# Patient Record
Sex: Female | Born: 1990 | Race: Black or African American | Hispanic: No | Marital: Single | Smoking: Former smoker
Health system: Southern US, Community
[De-identification: ages and names within clinical notes are randomized; demographics above are authoritative.]

## PROBLEM LIST (undated history)

## (undated) ENCOUNTER — Inpatient Hospital Stay (HOSPITAL_COMMUNITY): Payer: Self-pay

## (undated) ENCOUNTER — Ambulatory Visit: Admission: EM | Discharge status: Absent without leave | Payer: Medicaid Other

## (undated) DIAGNOSIS — M419 Scoliosis, unspecified: Secondary | ICD-10-CM

## (undated) DIAGNOSIS — Z789 Other specified health status: Secondary | ICD-10-CM

## (undated) DIAGNOSIS — O1490 Unspecified pre-eclampsia, unspecified trimester: Secondary | ICD-10-CM

## (undated) HISTORY — PX: NO PAST SURGERIES: SHX2092

## (undated) HISTORY — DX: Unspecified pre-eclampsia, unspecified trimester: O14.90

---
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
## 2012-06-19 ENCOUNTER — Encounter (HOSPITAL_COMMUNITY): Payer: Self-pay | Admitting: Emergency Medicine

## 2012-06-19 ENCOUNTER — Emergency Department (HOSPITAL_COMMUNITY): Payer: Federal, State, Local not specified - PPO

## 2012-06-19 ENCOUNTER — Emergency Department (HOSPITAL_COMMUNITY)
Admission: EM | Admit: 2012-06-19 | Discharge: 2012-06-19 | Disposition: A | Discharge status: Home / Self Care | Payer: Federal, State, Local not specified - PPO | Attending: Emergency Medicine | Admitting: Emergency Medicine

## 2012-06-19 DIAGNOSIS — N760 Acute vaginitis: Secondary | ICD-10-CM

## 2012-06-19 DIAGNOSIS — Z87891 Personal history of nicotine dependence: Secondary | ICD-10-CM | POA: Insufficient documentation

## 2012-06-19 DIAGNOSIS — N898 Other specified noninflammatory disorders of vagina: Secondary | ICD-10-CM | POA: Insufficient documentation

## 2012-06-19 DIAGNOSIS — N938 Other specified abnormal uterine and vaginal bleeding: Secondary | ICD-10-CM

## 2012-06-19 LAB — CBC WITH DIFFERENTIAL/PLATELET
Basophils Absolute: 0 10*3/uL (ref 0.0–0.1)
Basophils Relative: 0 % (ref 0–1)
Eosinophils Absolute: 0.1 10*3/uL (ref 0.0–0.7)
Eosinophils Relative: 2 % (ref 0–5)
Lymphocytes Relative: 40 % (ref 12–46)
MCHC: 33.6 g/dL (ref 30.0–36.0)
MCV: 87.3 fL (ref 78.0–100.0)
Platelets: 236 10*3/uL (ref 150–400)
RDW: 13.3 % (ref 11.5–15.5)
WBC: 4.5 10*3/uL (ref 4.0–10.5)

## 2012-06-19 LAB — URINALYSIS, ROUTINE W REFLEX MICROSCOPIC
Bilirubin Urine: NEGATIVE
Protein, ur: NEGATIVE mg/dL
Urobilinogen, UA: 1 mg/dL (ref 0.0–1.0)

## 2012-06-19 LAB — WET PREP, GENITAL: Trich, Wet Prep: NONE SEEN

## 2012-06-19 LAB — URINE MICROSCOPIC-ADD ON

## 2012-06-19 MED ORDER — SODIUM CHLORIDE 0.9 % IV BOLUS (SEPSIS)
1000.0000 mL | Freq: Once | INTRAVENOUS | Status: AC
  Administered 2012-06-19: 1000 mL via INTRAVENOUS

## 2012-06-19 MED ORDER — METRONIDAZOLE 500 MG PO TABS: 500.0000 mg | ORAL_TABLET | Freq: Two times a day (BID) | ORAL | Status: AC

## 2012-06-19 NOTE — ED Provider Notes (Addendum)
History     CSN: [id number]  Arrival date & time 06/19/12  1422   First MD Initiated Contact with Patient 06/19/12 1456      Chief Complaint  Patient presents with  . Vaginal Bleeding    (Consider location/radiation/quality/duration/timing/severity/associated sxs/prior treatment) HPI Comments: 21 y/o g0p0 woman w/ no medical hx, surgical hx comes in with cc of vaginal bleeding. Pt's LMP was 1 week ago. Pt states that she started having bleeding again 3 days ago. There is no pais associated with the bleeding. The blood is bright red, and she changes about 4 pads daily. There is no associated n/v/f/c/uti like symptoms. Pt has never had bleeding like this before. No clots passing. No hx of coagulopathy. Pt denies any trauma - but she did have intercourse the night before the bleeding started.  Patient is a 21 y.o. female presenting with vaginal bleeding. The history is provided by the patient.  Vaginal Bleeding Pertinent negatives include no chest pain, no abdominal pain, no headaches and no shortness of breath.    History reviewed. No pertinent past medical history.  History reviewed. No pertinent past surgical history.  No family history on file.  History  Substance Use Topics  . Smoking status: Former Games developer  . Smokeless tobacco: Never Used  . Alcohol Use: No    OB History    Grav Para Term Preterm Abortions TAB SAB Ect Mult Living                  Review of Systems  Constitutional: Positive for activity change.  HENT: Negative for neck pain.   Respiratory: Negative for shortness of breath.   Cardiovascular: Negative for chest pain.  Gastrointestinal: Negative for nausea, vomiting and abdominal pain.  Genitourinary: Positive for hematuria and vaginal bleeding. Negative for dysuria, frequency, flank pain, vaginal discharge, vaginal pain, menstrual problem and pelvic pain.  Neurological: Negative for headaches.    Allergies  Review of patient's allergies indicates no  known allergies.  Home Medications   Current Outpatient Rx  Name Route Sig Dispense Refill  . LEVONORGESTREL-ETHINYL ESTRAD 0.1-20 MG-MCG PO TABS Oral Take 1 tablet by mouth daily.      BP 135/73  Pulse 86  Temp 98.6 F (37 C) (Oral)  SpO2 100%  LMP 06/08/2012  Physical Exam  Constitutional: She is oriented to person, place, and time. She appears well-developed.  HENT:  Head: Normocephalic and atraumatic.  Eyes: Conjunctivae and EOM are normal. Pupils are equal, round, and reactive to light.  Neck: Normal range of motion. Neck supple.  Cardiovascular: Normal rate, regular rhythm, normal heart sounds and intact distal pulses.   No murmur heard. Pulmonary/Chest: Effort normal. No respiratory distress. She has no wheezes.  Abdominal: Soft. Bowel sounds are normal. She exhibits no distension. There is no tenderness. There is no rebound and no guarding.  Genitourinary: Vagina normal and uterus normal.       External exam - normal, no lesions Speculum exam: Pt has some white discharge, + blood. No laceration seen, bleeding seems to be from the cervix, as there is trace amount of red discharge noted post cleaning. Bimanual exam: Patient has no CMT, no adnexal tenderness or fullness and cervical os is closed  Neurological: She is alert and oriented to person, place, and time.  Skin: Skin is warm and dry.    ED Course  Procedures (including critical care time)   Labs Reviewed  URINALYSIS, ROUTINE W REFLEX MICROSCOPIC  PREGNANCY, URINE  GC/CHLAMYDIA PROBE  AMP, GENITAL  WET PREP, GENITAL   No results found.   No diagnosis found.    MDM  DDx includes: Ectopic pregnancy Ovarian cyst/hemorrhagic cyst Trauma/laceration Infectious processes - including STD Dysfunctional uterine bleeding Fibroid Foreign body Placental Previa Placenta abruption Uterine rupture Abortion - threatened, inevitable, spontaneous   22 y/o G0P0 woman with no medical, surgical or gyne hx comes  in with cc of vaginal bleeding. Will get a upreg and then further differentiate. Will need a pelvic exam. Will check CBC.          Derwood Kaplan, MD 06/19/12 1520  Derwood Kaplan, MD 06/19/12 1718

## 2012-06-19 NOTE — ED Notes (Signed)
Onset of bright red vaginal bleeding 3 days ago, heavy w/o clots. Normal period started Tuesday a week ago and ended Saturday.

## 2012-06-19 NOTE — ED Notes (Signed)
Patient is alert and oriented x3.  She states that she had a period cycle last week  And she has been vaginal bleeding for 3 days with no clots.  She states that she blood was Bright red for the last two days.  She denies any pain with the bleeding.

## 2012-06-22 ENCOUNTER — Ambulatory Visit (INDEPENDENT_AMBULATORY_CARE_PROVIDER_SITE_OTHER): Payer: Federal, State, Local not specified - PPO | Admitting: Physician Assistant

## 2012-06-22 VITALS — BP 122/80 | HR 76 | Temp 98.0°F | Resp 16 | Ht 65.25 in | Wt 119.6 lb

## 2012-06-22 DIAGNOSIS — Z111 Encounter for screening for respiratory tuberculosis: Secondary | ICD-10-CM

## 2012-06-22 LAB — GC/CHLAMYDIA PROBE AMP, GENITAL: GC Probe Amp, Genital: NEGATIVE

## 2012-06-22 NOTE — Progress Notes (Signed)
  Subjective:    Patient ID: Katie Brown, female    DOB: 02/22/1991, 21 y.o.   MRN: [id number]  HPI  Pt here for PPD placement.  She has gotten a new job and needs this done.  She has had a PPD in the past but never had a + result.  Review of Systems     Objective:   Physical Exam  Vitals reviewed. Constitutional: She appears well-developed and well-nourished.  HENT:  Head: Normocephalic and atraumatic.  Right Ear: External ear normal.  Left Ear: External ear normal.  Pulmonary/Chest: Effort normal.  Skin: Skin is warm and dry.  Psychiatric: She has a normal mood and affect. Her behavior is normal. Judgment and thought content normal.      Assessment & Plan:  PPD placed.

## 2012-06-24 ENCOUNTER — Encounter (INDEPENDENT_AMBULATORY_CARE_PROVIDER_SITE_OTHER): Payer: Federal, State, Local not specified - PPO

## 2012-06-24 DIAGNOSIS — Z111 Encounter for screening for respiratory tuberculosis: Secondary | ICD-10-CM

## 2012-06-25 LAB — TB SKIN TEST

## 2012-12-06 ENCOUNTER — Telehealth: Payer: Self-pay

## 2012-12-06 NOTE — Telephone Encounter (Signed)
PT HAD A PE DONE AND WOULD LIKE TO COME BY AND P/U A COPY. STATES SHE NEED IT TODAY IF AT ALL POSSIBLE PLEASE CALL [phone]

## 2012-12-06 NOTE — Telephone Encounter (Signed)
Chart only shows TB placement done here, not PE. Records are in drawer ready to be picked up. LMOM

## 2013-09-30 ENCOUNTER — Emergency Department (HOSPITAL_COMMUNITY)
Admission: EM | Admit: 2013-09-30 | Discharge: 2013-09-30 | Disposition: A | Discharge status: Home / Self Care | Payer: Federal, State, Local not specified - PPO | Attending: Emergency Medicine | Admitting: Emergency Medicine

## 2013-09-30 DIAGNOSIS — N949 Unspecified condition associated with female genital organs and menstrual cycle: Secondary | ICD-10-CM | POA: Insufficient documentation

## 2013-09-30 DIAGNOSIS — R197 Diarrhea, unspecified: Secondary | ICD-10-CM | POA: Insufficient documentation

## 2013-09-30 DIAGNOSIS — Z3202 Encounter for pregnancy test, result negative: Secondary | ICD-10-CM | POA: Insufficient documentation

## 2013-09-30 DIAGNOSIS — N910 Primary amenorrhea: Secondary | ICD-10-CM

## 2013-09-30 DIAGNOSIS — Z87891 Personal history of nicotine dependence: Secondary | ICD-10-CM | POA: Insufficient documentation

## 2013-09-30 DIAGNOSIS — R1031 Right lower quadrant pain: Secondary | ICD-10-CM | POA: Insufficient documentation

## 2013-09-30 DIAGNOSIS — N938 Other specified abnormal uterine and vaginal bleeding: Secondary | ICD-10-CM | POA: Insufficient documentation

## 2013-09-30 DIAGNOSIS — Z79899 Other long term (current) drug therapy: Secondary | ICD-10-CM | POA: Insufficient documentation

## 2013-09-30 LAB — URINE MICROSCOPIC-ADD ON

## 2013-09-30 LAB — URINALYSIS, ROUTINE W REFLEX MICROSCOPIC
Bilirubin Urine: NEGATIVE
Ketones, ur: NEGATIVE mg/dL
Protein, ur: NEGATIVE mg/dL
Urobilinogen, UA: 1 mg/dL (ref 0.0–1.0)

## 2013-09-30 MED ORDER — ACETAMINOPHEN 325 MG PO TABS
975.0000 mg | ORAL_TABLET | Freq: Once | ORAL | Status: AC
  Administered 2013-09-30: 975 mg via ORAL
  Filled 2013-09-30: qty 3

## 2013-09-30 NOTE — ED Provider Notes (Signed)
CSN: [id number]     Arrival date & time 09/30/13  0902 History   First MD Initiated Contact with Patient 09/30/13 [phone]     No chief complaint on file.  (Consider location/radiation/quality/duration/timing/severity/associated sxs/prior Treatment) HPI Patient presents with vaginal bleeding, vaginal cramping, and 1 episode of diarrhea with RLQ pain this morning.  The pain in her RLQ woke her from sleep, was sharp and achy, and resolved after the episode of diarrhea.  Pt has had vaginal bleeding that began today, has not had a period since June.  Had negative pregnancy test in September.  Denies current abdominal pain.  Denies urinary symptoms, n/V, fever, chills.   No past medical history on file. No past surgical history on file. No family history on file. History  Substance Use Topics  . Smoking status: Former Games developer  . Smokeless tobacco: Never Used  . Alcohol Use: No   OB History   Grav Para Term Preterm Abortions TAB SAB Ect Mult Living                 Review of Systems  Constitutional: Negative for fever and chills.  Respiratory: Negative for cough and shortness of breath.   Cardiovascular: Negative for chest pain.  Gastrointestinal: Positive for abdominal pain and diarrhea. Negative for nausea and vomiting.  Genitourinary: Positive for vaginal bleeding and menstrual problem. Negative for dysuria, urgency, frequency and vaginal discharge.  Musculoskeletal: Negative for myalgias.    Allergies  Review of patient's allergies indicates no known allergies.  Home Medications   Current Outpatient Rx  Name  Route  Sig  Dispense  Refill  . Probiotic Product (PROBIOTIC PO)   Oral   Take 1 capsule by mouth daily.          BP 119/73  Pulse 75  Temp(Src) 98.3 F (36.8 C) (Oral)  Resp 18  SpO2 100% Physical Exam  Nursing note and vitals reviewed. Constitutional: She appears well-developed and well-nourished. No distress.  HENT:  Head: Normocephalic and atraumatic.  Neck:  Neck supple.  Cardiovascular: Normal rate and regular rhythm.   Pulmonary/Chest: Effort normal and breath sounds normal. No respiratory distress. She has no wheezes. She has no rales.  Abdominal: Soft. Bowel sounds are normal. She exhibits no distension. There is no tenderness. There is no rebound and no guarding.  Neurological: She is alert.  Skin: She is not diaphoretic.    ED Course  Procedures (including critical care time) Labs Review Labs Reviewed  WET PREP, GENITAL  GC/CHLAMYDIA PROBE AMP  URINALYSIS, ROUTINE W REFLEX MICROSCOPIC  POCT PREGNANCY, URINE   Imaging Review No results found.  EKG Interpretation   None      10:52 AM Pt requests d/c home.  States the cramping is feeling more like her normal menstrual period now and she is having bleeding, states she was initially worried because it was more on one side than the other and that is not normal for her.  Denies any urinary symptoms, abnormal vaginal discharge, or any concerns for vaginal infection.  Will therefore cancel pelvic exam and labs.  Pt declines waiting for UA results.   MDM   1. Delayed menses    Pt with no period for several months p/w lower abdominal pain and vaginal bleeding.  She did have one episode of RLQ pain that resolved with one episode of diarrhea this morning, then began developing her normal vaginal bleeding and cramping associated with menses.  She is not pregnant.  No other associated symptoms.  Pt requested discharge prior to completion of all studies- see note above.  I do agree that this is very likely to be her menstrual period.  Discussed result, findings, treatment, and follow up  with patient.  Pt given return precautions.  Pt verbalizes understanding and agrees with plan.      I doubt any other EMC precluding discharge at this time including, but not necessarily limited to the following: appendicitis, PID (from history only), ectopic pregnancy    Trixie Dredge, PA-C 09/30/13 1058

## 2013-09-30 NOTE — ED Provider Notes (Signed)
Medical screening examination/treatment/procedure(s) were performed by non-physician practitioner and as supervising physician I was immediately available for consultation/collaboration.  EKG Interpretation   None         Shanna Cisco, MD 09/30/13 2228

## 2013-09-30 NOTE — ED Notes (Addendum)
Pt stated that she has right lower abd pain and vaginal bleeding that started this morning. Pt stated that she has not had a period since June which is normal for her to have irregular periods. She took a pregnancy test and it was negative.

## 2013-09-30 NOTE — ED Notes (Signed)
Family at bedside. 

## 2013-09-30 NOTE — ED Notes (Signed)
Pt refusing pelvic exam.

## 2013-09-30 NOTE — Discharge Instructions (Signed)
Read the information below.  You may return to the Emergency Department at any time for worsening condition or any new symptoms that concern you. Please follow up with your obgyn about your irregular periods.  If you develop high fevers, worsening abdominal pain, uncontrolled vomiting, or are unable to tolerate fluids by mouth, return to the ER for a recheck.

## 2016-04-24 ENCOUNTER — Ambulatory Visit (INDEPENDENT_AMBULATORY_CARE_PROVIDER_SITE_OTHER): Payer: Federal, State, Local not specified - PPO | Admitting: Physician Assistant

## 2016-04-24 VITALS — BP 112/76 | HR 66 | Temp 98.3°F | Resp 16 | Ht 66.0 in | Wt 137.0 lb

## 2016-04-24 DIAGNOSIS — Z711 Person with feared health complaint in whom no diagnosis is made: Secondary | ICD-10-CM

## 2016-04-24 DIAGNOSIS — Z111 Encounter for screening for respiratory tuberculosis: Secondary | ICD-10-CM

## 2016-04-24 DIAGNOSIS — IMO0001 Reserved for inherently not codable concepts without codable children: Secondary | ICD-10-CM

## 2016-04-24 NOTE — Addendum Note (Signed)
Addended by: Trena PlattENGLISH, Carollee Nussbaumer D on: 04/24/2016 02:28 PM   Modules accepted: Level of Service

## 2016-04-24 NOTE — Progress Notes (Signed)
[facility] [address], [place] [address] [phone]  Date:  04/24/2016   Name:  Katie Brown   DOB:  10-Jun-1991   MRN:  [id number]  PCP:  No primary care provider on file.    History of Present Illness:  Katie Brown is a 25 y.o. female patient [facility]    To Whom It May Concern:  This patient was screened for tuberculosis using current guidelines from the Centers for Disease Control, and does not have tuberculosis in the communicable form.   Tuberculosis Risk Questionnaire  1. no Were you born outside the [place] in one of the following parts of the world: [place], [place], [place], [place] or [place]?    2. no Have you traveled outside the [place] and lived for more than one month in one of the following parts of the world: [place], [place], [place], [place] or [place]?    3. no Do you have a compromised immune system such as from any of the following conditions:HIV/AIDS, organ or bone marrow transplantation, diabetes, immunosuppressive medicines (e.g. Prednisone, Remicaide), leukemia, lymphoma, cancer of the head or neck, gastrectomy or jejunal bypass, end-stage renal disease (on dialysis), or silicosis?     4. no Have you ever or do you plan on working in: a residential care center, a health care facility, a jail or prison or homeless shelter?    5. no Have you ever: injected illegal drugs, used crack cocaine, lived in a homeless shelter  or been in jail or prison?     6. no Have you ever been exposed to anyone with infectious tuberculosis?    Tuberculosis Symptom Questionnaire  Do you currently have any of the following symptoms?  1. no  Unexplained cough lasting more than 3 weeks?   2.  no  Unexplained fever lasting more than 3 weeks.   3. no Night Sweats (sweating that leaves the bedclothes and sheets wet)     4. no Shortness of Breath   5. no Chest Pain   6. no Unintentional weight loss     7. no Unexplained fatigue (very tired for no reason)     There are no active problems to display for this patient.   History reviewed. No pertinent past medical history.  History reviewed. No pertinent past surgical history.  Social History  Substance Use Topics  . Smoking status: Former Games developermoker  . Smokeless tobacco: Never Used  . Alcohol Use: No    History reviewed. No pertinent family history.  No Known Allergies  Medication list has been reviewed and updated.  No current outpatient prescriptions on file prior to visit.   No current facility-administered medications on file prior to visit.    Review of Systems  Constitutional: Positive for chills. Negative for fever.  Respiratory: Negative for cough, shortness of breath and wheezing.   Cardiovascular: Negative for chest pain, palpitations and leg swelling.  Musculoskeletal: Negative for back pain and joint pain.  Skin: Negative for itching and rash.  Neurological: Negative for dizziness.     Physical Examination: BP 112/76 mmHg  Pulse 66  Temp(Src) 98.3 F (36.8 C) (Oral)  Resp 16  Ht 5\' 6"  (1.676 m)  Wt 137 lb (62.143 kg)  BMI 22.12 kg/m2  SpO2 100%  LMP 04/24/2016 (Exact Date) Ideal Body Weight: Weight in (lb) to have BMI = 25: 154.6  Physical Exam  Constitutional: She is oriented to person, place, and time.  She appears well-developed and well-nourished. No distress.  HENT:  Head: Normocephalic and atraumatic.  Right Ear: External ear normal.  Left Ear: External ear normal.  Eyes: Conjunctivae and EOM are normal. Pupils are equal, round, and reactive to light.  Cardiovascular: Normal rate, regular rhythm and normal heart sounds.  Exam reveals no friction rub.   No murmur heard. Pulmonary/Chest: Effort normal and breath sounds normal. No respiratory distress. She has no wheezes.  Musculoskeletal: Normal range of motion. She exhibits no edema or tenderness.  Neurological: She is alert and oriented  to person, place, and time.  Skin: Skin is warm and dry. She is not diaphoretic.  Psychiatric: She has a normal mood and affect. Her behavior is normal.     Assessment and Plan: Katie Brown is a 25 y.o. female who is here today for need of TB test and completion of form Tb placed today.   Questionnaire non-concerning.  rtc in 48-72 hours for ppd read.   PPD screening test - Plan: TB Skin Test, Care order/instruction  Normal appearance   Trena Platt, PA-C [facility] 04/24/2016 11:29 AM

## 2016-04-24 NOTE — Patient Instructions (Signed)
Tuberculin Skin Test WHY AM I HAVING THIS TEST? Tuberculosis (TB) is a bacterial infection caused by Mycobacterium tuberculosis. Most people who are exposed to these bacteria have a strong enough defense (immune) system to prevent the bacteria from causing TB and developing symptoms. Their bodies prevent the germs from being active and making them sick (latent TB infection).  However, if you have TB germs in your body and your immune system is weak, you can develop a TB infection. This can cause symptoms such as:   Night sweats.  Fever.  Weakness.  Weight loss. A latent TB infection can also become active later in life if your immune system becomes weakened or compromised. You may have this test if your health care provider suspects that you have TB. You may also have this test to screen for TB if you are at risk for getting the disease. Those at increased risk include:  People who inject illegal drugs or share needles.  People with HIV or other diseases that affect immunity.  Health care workers.  People who live in high-risk communities, such as homeless shelters, nursing homes, and correctional facilities.  People who have been in contact with someone with TB.  People from countries where TB is more common. If you are in a high-risk group, your health care provider may wish to screen for TB more often. This can help prevent the spread of the disease. Sometimes TB screening is required when starting a new job, such as becoming a health care worker or a teacher. Colleges or universities may require it of new students. HOW WILL I BE TESTED? A tuberculin skin test is the main test used to check for exposure to the bacteria that can cause TB. The test checks for antibodies to the bacteria. Antibodies are proteins that your body produces to protect you from germs and other things that can make you sick. Your health care provider will inject a solution known as PPD (purified protein  derivative) under the first layer of skin on your arm. This causes a blister-like bubble to form at the site. Your health care provider will then examine the site after a number of hours have passed to see if a reaction has occurred. HOW DO I PREPARE FOR THE TEST? There is no preparation required for this test. WHAT DO THE RESULTS MEAN? Your test results will be reported as either negative or positive.  If the tuberculin skin test produces a negative result, it is likely that you do not have TB and have not been exposed to the TB bacteria. If you or your health care provider suspects exposure, however, you may want to repeat the test a few weeks later. A blood test may also be used to check for TB. This is because you will not react to the tuberculin skin test until several weeks after exposure to TB bacteria. If you test positive to the tuberculin skin test, it is likely that you have been exposed to TB bacteria. The test does not distinguish between an active and a latent TB infection. A false-positive result can occur. A false-positive result for TB bacteria is incorrect because it indicates a condition or finding is present when it is not. Talk to your health care provider to discuss your results, treatment options, and if necessary, the need for more tests. It is your responsibility to obtain your test results. Ask the lab or department performing the test when and how you will get your results. Talk with your   health care provider if you have any questions about your results.   This information is not intended to replace advice given to you by your health care provider. Make sure you discuss any questions you have with your health care provider.   Document Released: 08/20/2005 Document Revised: 12/01/2014 Document Reviewed: 03/06/2014 Elsevier Interactive Patient Education 2016 Elsevier Inc.  

## 2017-03-03 ENCOUNTER — Encounter (HOSPITAL_COMMUNITY): Payer: Self-pay

## 2017-03-03 ENCOUNTER — Emergency Department (HOSPITAL_COMMUNITY)
Admission: EM | Admit: 2017-03-03 | Discharge: 2017-03-03 | Disposition: A | Discharge status: Home / Self Care | Payer: Federal, State, Local not specified - PPO | Attending: Emergency Medicine | Admitting: Emergency Medicine

## 2017-03-03 ENCOUNTER — Emergency Department (HOSPITAL_COMMUNITY): Payer: Federal, State, Local not specified - PPO

## 2017-03-03 DIAGNOSIS — Y92410 Unspecified street and highway as the place of occurrence of the external cause: Secondary | ICD-10-CM | POA: Diagnosis not present

## 2017-03-03 DIAGNOSIS — Z87891 Personal history of nicotine dependence: Secondary | ICD-10-CM | POA: Diagnosis not present

## 2017-03-03 DIAGNOSIS — M542 Cervicalgia: Secondary | ICD-10-CM | POA: Insufficient documentation

## 2017-03-03 DIAGNOSIS — Y939 Activity, unspecified: Secondary | ICD-10-CM | POA: Diagnosis not present

## 2017-03-03 DIAGNOSIS — S3992XA Unspecified injury of lower back, initial encounter: Secondary | ICD-10-CM | POA: Insufficient documentation

## 2017-03-03 DIAGNOSIS — Y999 Unspecified external cause status: Secondary | ICD-10-CM | POA: Insufficient documentation

## 2017-03-03 LAB — POC URINE PREG, ED: Preg Test, Ur: NEGATIVE

## 2017-03-03 MED ORDER — CYCLOBENZAPRINE HCL 10 MG PO TABS
10.0000 mg | ORAL_TABLET | Freq: Once | ORAL | Status: AC
  Administered 2017-03-03: 10 mg via ORAL
  Filled 2017-03-03: qty 1
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
## 2017-03-03 NOTE — ED Notes (Signed)
Pt reported to have left from ED xray. NP made aware.
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
## 2017-10-24 ENCOUNTER — Emergency Department: Payer: Federal, State, Local not specified - PPO

## 2017-10-24 ENCOUNTER — Other Ambulatory Visit: Payer: Self-pay

## 2017-10-24 ENCOUNTER — Emergency Department
Admission: EM | Admit: 2017-10-24 | Discharge: 2017-10-25 | Disposition: A | Discharge status: Home / Self Care | Payer: Federal, State, Local not specified - PPO | Attending: Emergency Medicine | Admitting: Emergency Medicine

## 2017-10-24 ENCOUNTER — Encounter: Payer: Self-pay | Admitting: Emergency Medicine

## 2017-10-24 DIAGNOSIS — R112 Nausea with vomiting, unspecified: Secondary | ICD-10-CM | POA: Diagnosis present

## 2017-10-24 DIAGNOSIS — Z87891 Personal history of nicotine dependence: Secondary | ICD-10-CM | POA: Insufficient documentation

## 2017-10-24 DIAGNOSIS — O9989 Other specified diseases and conditions complicating pregnancy, childbirth and the puerperium: Secondary | ICD-10-CM | POA: Diagnosis not present

## 2017-10-24 DIAGNOSIS — R109 Unspecified abdominal pain: Secondary | ICD-10-CM

## 2017-10-24 DIAGNOSIS — N83202 Unspecified ovarian cyst, left side: Secondary | ICD-10-CM | POA: Insufficient documentation

## 2017-10-24 DIAGNOSIS — Z3490 Encounter for supervision of normal pregnancy, unspecified, unspecified trimester: Secondary | ICD-10-CM | POA: Insufficient documentation

## 2017-10-24 DIAGNOSIS — O219 Vomiting of pregnancy, unspecified: Secondary | ICD-10-CM | POA: Insufficient documentation

## 2017-10-24 LAB — CBC
HCT: 44.1 % (ref 35.0–47.0)
HEMOGLOBIN: 14.9 g/dL (ref 12.0–16.0)
MCH: 29.6 pg (ref 26.0–34.0)
MCHC: 33.8 g/dL (ref 32.0–36.0)
MCV: 87.6 fL (ref 80.0–100.0)
Platelets: 323 10*3/uL (ref 150–440)
RBC: 5.04 MIL/uL (ref 3.80–5.20)
RDW: 13.9 % (ref 11.5–14.5)
WBC: 8.3 10*3/uL (ref 3.6–11.0)

## 2017-10-24 LAB — URINALYSIS, COMPLETE (UACMP) WITH MICROSCOPIC
Bilirubin Urine: NEGATIVE
GLUCOSE, UA: NEGATIVE mg/dL
HGB URINE DIPSTICK: NEGATIVE
KETONES UR: 20 mg/dL — AB
LEUKOCYTES UA: NEGATIVE
NITRITE: NEGATIVE
PROTEIN: 30 mg/dL — AB
RBC / HPF: NONE SEEN RBC/hpf (ref 0–5)
Specific Gravity, Urine: 1.029 (ref 1.005–1.030)
pH: 5 (ref 5.0–8.0)

## 2017-10-24 LAB — COMPREHENSIVE METABOLIC PANEL
ALK PHOS: 56 U/L (ref 38–126)
ALT: 16 U/L (ref 14–54)
ANION GAP: 10 (ref 5–15)
AST: 18 U/L (ref 15–41)
Albumin: 4.8 g/dL (ref 3.5–5.0)
BILIRUBIN TOTAL: 1.1 mg/dL (ref 0.3–1.2)
BUN: 9 mg/dL (ref 6–20)
CALCIUM: 9.5 mg/dL (ref 8.9–10.3)
CO2: 24 mmol/L (ref 22–32)
Chloride: 101 mmol/L (ref 101–111)
Creatinine, Ser: 0.76 mg/dL (ref 0.44–1.00)
GFR calc non Af Amer: >60 mL/min (ref >60)
Glucose, Bld: 91 mg/dL (ref 65–99)
Potassium: 3.2 mmol/L — ABNORMAL LOW (ref 3.5–5.1)
SODIUM: 135 mmol/L (ref 135–145)
TOTAL PROTEIN: 8.3 g/dL — AB (ref 6.5–8.1)

## 2017-10-24 LAB — LIPASE, BLOOD: Lipase: 20 U/L (ref 11–51)

## 2017-10-24 LAB — POCT PREGNANCY, URINE: Preg Test, Ur: POSITIVE — AB

## 2017-10-24 MED ORDER — ONDANSETRON 4 MG PO TBDP
4.0000 mg | ORAL_TABLET | Freq: Once | ORAL | Status: AC | PRN
  Administered 2017-10-24: 4 mg via ORAL
  Filled 2017-10-24: qty 1

## 2017-10-24 MED ORDER — METOCLOPRAMIDE HCL 5 MG/ML IJ SOLN
10.0000 mg | Freq: Once | INTRAMUSCULAR | Status: AC
  Administered 2017-10-24: 10 mg via INTRAVENOUS
  Filled 2017-10-24: qty 2

## 2017-10-24 MED ORDER — DIPHENHYDRAMINE HCL 50 MG/ML IJ SOLN
25.0000 mg | Freq: Once | INTRAMUSCULAR | Status: AC
  Administered 2017-10-24: 25 mg via INTRAVENOUS
  Filled 2017-10-24: qty 1

## 2017-10-24 MED ORDER — METOCLOPRAMIDE HCL 10 MG PO TABS: 10.0000 mg | ORAL_TABLET | Freq: Three times a day (TID) | ORAL | 0 refills | Status: DC | PRN

## 2017-10-24 MED ORDER — POTASSIUM CHLORIDE CRYS ER 20 MEQ PO TBCR
40.0000 meq | EXTENDED_RELEASE_TABLET | Freq: Once | ORAL | Status: AC
  Administered 2017-10-24: 40 meq via ORAL
  Filled 2017-10-24: qty 2

## 2017-10-24 MED ORDER — SODIUM CHLORIDE 0.9 % IV BOLUS (SEPSIS)
1000.0000 mL | Freq: Once | INTRAVENOUS | Status: AC
  Administered 2017-10-24: 1000 mL via INTRAVENOUS

## 2017-10-24 NOTE — ED Provider Notes (Signed)
[facility] Emergency Department Provider Note   ____________________________________________   I have reviewed the triage vital signs and the nursing notes.   HISTORY  Chief Complaint Abdominal Pain   History limited by: Not Limited   HPI Katie Brown is a 26 y.o. female who presents to the emergency department today because of concern for nausea and vomiting.  DURATION:2 days TIMING: intermittent SEVERITY: severe QUALITY: non bloody CONTEXT: patient states that for the past couple of days she has been having problems with nausea and vomiting. Has not been able to keep anything down.  MODIFYING FACTORS: none ASSOCIATED SYMPTOMS: patient has also had lower abdominal pain.   Per medical record review patient has no significant past medical history.  History reviewed. No pertinent past medical history.  There are no active problems to display for this patient.   History reviewed. No pertinent surgical history.  Prior to Admission medications   Not on File    Allergies Patient has no known allergies.  No family history on file.  Social History Social History   Tobacco Use  . Smoking status: Former Games developermoker  . Smokeless tobacco: Never Used  Substance Use Topics  . Alcohol use: No  . Drug use: No    Review of Systems Constitutional: No fever/chills Eyes: No visual changes. ENT: No sore throat. Cardiovascular: Denies chest pain. Respiratory: Denies shortness of breath. Gastrointestinal: Positive for lower abdominal pain. Positive for nausea and vomiting.    Genitourinary: Negative for dysuria. Musculoskeletal: Negative for back pain. Skin: Negative for rash. Neurological: Negative for headaches, focal weakness or numbness.  ____________________________________________   PHYSICAL EXAM:  VITAL SIGNS: ED Triage Vitals  Enc Vitals Group     BP 10/24/17 2018 121/81     Pulse Rate 10/24/17 2018 81     Resp 10/24/17 2018 18   Temp 10/24/17 2018 98.5 F (36.9 C)     Temp Source 10/24/17 2018 Oral     SpO2 10/24/17 2018 99 %     Weight 10/24/17 2020 140 lb (63.5 kg)     Height 10/24/17 2020 5\' 5"  (1.651 m)     Head Circumference --      Peak Flow --      Pain Score 10/24/17 2018 6   Constitutional: Alert and oriented. Well appearing and in no distress. Eyes: Conjunctivae are normal.  ENT   Head: Normocephalic and atraumatic.   Nose: No congestion/rhinnorhea.   Mouth/Throat: Mucous membranes are moist.   Neck: No stridor. Hematological/Lymphatic/Immunilogical: No cervical lymphadenopathy. Cardiovascular: Normal rate, regular rhythm.  No murmurs, rubs, or gallops.  Respiratory: Normal respiratory effort without tachypnea nor retractions. Breath sounds are clear and equal bilaterally. No wheezes/rales/rhonchi. Gastrointestinal: Soft and non tender. No rebound. No guarding.  Genitourinary: Deferred Musculoskeletal: Normal range of motion in all extremities. No lower extremity edema. Neurologic:  Normal speech and language. No gross focal neurologic deficits are appreciated.  Skin:  Skin is warm, dry and intact. No rash noted. Psychiatric: Mood and affect are normal. Speech and behavior are normal. Patient exhibits appropriate insight and judgment.  ____________________________________________    LABS (pertinent positives/negatives)  Upreg positive CMP k 3.2 Lipase 20 CBC wnl UA not consistent with infection  ____________________________________________   EKG  None  ____________________________________________    RADIOLOGY  US pending  ____________________________________________   PROCEDURES  Procedures  ____________________________________________   INITIAL IMPRESSION / ASSESSMENT AND PLAN / ED COURSE  Pertinent labs & imaging results that were available during my care  of the patient were reviewed by me and considered in my medical decision making (see chart for  details).  Patient presented to the emergency department today because of concerns for nausea vomiting and lower abdominal pain.  Differential would include pancreatitis, gastritis, duodenitis, diverticulitis, appendicitis, pregnancy amongst other etiologies.  Workup does show positive urine pregnancy.  Think this would explain the patient's symptoms.  She was also slightly hypokalemic.  Patient will be given potassium in the emergency department.  Given that she has been having some lower abdominal pain will get ultrasound to evaluate for intrauterine pregnancy.  Discussed the positive pregnancy with the patient.   ____________________________________________   FINAL CLINICAL IMPRESSION(S) / ED DIAGNOSES  Final diagnoses:  Pregnancy, unspecified gestational age  Nausea and vomiting during pregnancy     Note: This dictation was prepared with Dragon dictation. Any transcriptional errors that result from this process are unintentional     Phineas SemenGoodman, Sydnie Sigmund, MD 10/24/17 2329

## 2017-10-24 NOTE — ED Triage Notes (Signed)
Pt reports that she has not been able to hold anything down since Thursday and c/o of lower abdominal pain. Pt is in NAD at this time.

## 2017-10-24 NOTE — Discharge Instructions (Addendum)
Please start taking prenatal vitamins. Please seek medical attention for any high fevers, chest pain, shortness of breath, change in behavior, persistent vomiting, bloody stool or any other new or concerning symptoms.  

## 2017-10-25 LAB — HCG, QUANTITATIVE, PREGNANCY: HCG, BETA CHAIN, QUANT, S: 47301 m[IU]/mL — AB (ref <5)

## 2017-10-25 NOTE — ED Provider Notes (Signed)
-----------------------------------------   1:25 AM on 10/25/2017 -----------------------------------------  Patient was able to tolerate PO without emesis.  Ultrasound interpreted per Dr. Jake SamplesFujinaga:  1. Single viable intrauterine pregnancy as above.  2. 1.7 cm complex cystic lesion left ovary, possible hemorrhagic  cyst versus endometrioma.   Updated patient of ultrasound results.  Strict return precautions given.  Patient verbalizes understanding and agrees with plan of care.   Irean HongSung, Francessca Friis J, MD 10/25/17 [id number]

## 2017-10-30 ENCOUNTER — Other Ambulatory Visit: Payer: Self-pay

## 2017-10-30 ENCOUNTER — Encounter: Payer: Self-pay | Admitting: Emergency Medicine

## 2017-10-30 ENCOUNTER — Emergency Department
Admission: EM | Admit: 2017-10-30 | Discharge: 2017-10-30 | Disposition: A | Discharge status: Home / Self Care | Payer: Federal, State, Local not specified - PPO | Attending: Emergency Medicine | Admitting: Emergency Medicine

## 2017-10-30 DIAGNOSIS — O219 Vomiting of pregnancy, unspecified: Secondary | ICD-10-CM

## 2017-10-30 DIAGNOSIS — R8271 Bacteriuria: Secondary | ICD-10-CM | POA: Diagnosis not present

## 2017-10-30 DIAGNOSIS — Z87891 Personal history of nicotine dependence: Secondary | ICD-10-CM | POA: Diagnosis not present

## 2017-10-30 DIAGNOSIS — O9989 Other specified diseases and conditions complicating pregnancy, childbirth and the puerperium: Secondary | ICD-10-CM | POA: Diagnosis present

## 2017-10-30 DIAGNOSIS — Z3A01 Less than 8 weeks gestation of pregnancy: Secondary | ICD-10-CM | POA: Diagnosis not present

## 2017-10-30 LAB — COMPREHENSIVE METABOLIC PANEL
ALBUMIN: 4.3 g/dL (ref 3.5–5.0)
ALK PHOS: 48 U/L (ref 38–126)
ALT: 13 U/L — AB (ref 14–54)
ANION GAP: 10 (ref 5–15)
AST: 16 U/L (ref 15–41)
BILIRUBIN TOTAL: 1.1 mg/dL (ref 0.3–1.2)
BUN: 8 mg/dL (ref 6–20)
CALCIUM: 9.3 mg/dL (ref 8.9–10.3)
CO2: 21 mmol/L — ABNORMAL LOW (ref 22–32)
CREATININE: 0.59 mg/dL (ref 0.44–1.00)
Chloride: 101 mmol/L (ref 101–111)
GFR calc Af Amer: >60 mL/min (ref >60)
GFR calc non Af Amer: >60 mL/min (ref >60)
GLUCOSE: 84 mg/dL (ref 65–99)
Potassium: 3.2 mmol/L — ABNORMAL LOW (ref 3.5–5.1)
Sodium: 132 mmol/L — ABNORMAL LOW (ref 135–145)
TOTAL PROTEIN: 7.6 g/dL (ref 6.5–8.1)

## 2017-10-30 LAB — CBC
HCT: 42.2 % (ref 35.0–47.0)
HEMOGLOBIN: 14.4 g/dL (ref 12.0–16.0)
MCH: 29.5 pg (ref 26.0–34.0)
MCHC: 34.2 g/dL (ref 32.0–36.0)
MCV: 86.5 fL (ref 80.0–100.0)
PLATELETS: 263 10*3/uL (ref 150–440)
RBC: 4.88 MIL/uL (ref 3.80–5.20)
RDW: 13.5 % (ref 11.5–14.5)
WBC: 7.2 10*3/uL (ref 3.6–11.0)

## 2017-10-30 LAB — URINALYSIS, COMPLETE (UACMP) WITH MICROSCOPIC
Bilirubin Urine: NEGATIVE
GLUCOSE, UA: NEGATIVE mg/dL
HGB URINE DIPSTICK: NEGATIVE
Ketones, ur: 80 mg/dL — AB
NITRITE: NEGATIVE
PH: 6 (ref 5.0–8.0)
Protein, ur: 100 mg/dL — AB
SPECIFIC GRAVITY, URINE: 1.032 — AB (ref 1.005–1.030)

## 2017-10-30 LAB — LIPASE, BLOOD: Lipase: 17 U/L (ref 11–51)

## 2017-10-30 MED ORDER — DOXYLAMINE-PYRIDOXINE 10-10 MG PO TBEC
2.0000 | DELAYED_RELEASE_TABLET | Freq: Three times a day (TID) | ORAL | 1 refills | Status: DC
Start: 2017-10-30 — End: 2017-12-25

## 2017-10-30 MED ORDER — ONDANSETRON 4 MG PO TBDP
4.0000 mg | ORAL_TABLET | Freq: Once | ORAL | Status: AC
  Administered 2017-10-30: 4 mg via ORAL

## 2017-10-30 MED ORDER — DOXYLAMINE SUCCINATE (SLEEP) 25 MG PO TABS
25.0000 mg | ORAL_TABLET | Freq: Once | ORAL | Status: AC
  Administered 2017-10-30: 25 mg via ORAL
  Filled 2017-10-30: qty 1

## 2017-10-30 MED ORDER — PYRIDOXINE HCL 25 MG PO TABS
25.0000 mg | ORAL_TABLET | Freq: Once | ORAL | Status: AC
  Administered 2017-10-30: 25 mg via ORAL
  Filled 2017-10-30: qty 1

## 2017-10-30 MED ORDER — NITROFURANTOIN MONOHYD MACRO 100 MG PO CAPS: 100.0000 mg | ORAL_CAPSULE | Freq: Two times a day (BID) | ORAL | 0 refills | Status: AC

## 2017-10-30 MED ORDER — NITROFURANTOIN MONOHYD MACRO 100 MG PO CAPS
100.0000 mg | ORAL_CAPSULE | Freq: Once | ORAL | Status: AC
  Administered 2017-10-30: 100 mg via ORAL
  Filled 2017-10-30: qty 1

## 2017-10-30 MED ORDER — METOCLOPRAMIDE HCL 5 MG/ML IJ SOLN
10.0000 mg | Freq: Once | INTRAMUSCULAR | Status: AC
  Administered 2017-10-30: 10 mg via INTRAVENOUS
  Filled 2017-10-30: qty 2

## 2017-10-30 MED ORDER — SODIUM CHLORIDE 0.9 % IV BOLUS (SEPSIS)
1000.0000 mL | Freq: Once | INTRAVENOUS | Status: AC
  Administered 2017-10-30: 1000 mL via INTRAVENOUS

## 2017-10-30 MED ORDER — ONDANSETRON 4 MG PO TBDP
ORAL_TABLET | ORAL | Status: AC
  Filled 2017-10-30: qty 1

## 2017-10-30 MED ORDER — DEXTROSE-NACL 5-0.9 % IV SOLN
INTRAVENOUS | Status: DC
  Administered 2017-10-30: 11:00:00 via INTRAVENOUS

## 2017-10-30 NOTE — ED Notes (Signed)
Pt reminded we need urine sample

## 2017-10-30 NOTE — ED Triage Notes (Signed)
Pt to ED via POV c/o intermittent abdominal pain, N/V. Pt states that she recently found out that she is approximately [redacted] weeks pregnant. Pt states that she has not been able to keep anything down and feels like she is dehydrated. Pt in NAD at this time.

## 2017-10-30 NOTE — ED Provider Notes (Signed)
[facility] Emergency Department Provider Note  ____________________________________________  Time seen: Approximately 9:58 AM  I have reviewed the triage vital signs and the nursing notes.   HISTORY  Chief Complaint Emesis and Abdominal Pain   HPI Katie Brown is a 26 y.o. female G1P0 at [redacted]w[redacted]d gestational age who presents for evaluation of nausea and vomiting. Patient reports that she has been vomiting for 1 week and unable to keep anything down. Patient is complaining of feeling hungry but unable to eat. Has been taking Reglan PO at home with no relief. No vaginal bleeding, no vaginal discharge, no fever or chills, no dysuria, no URI sx, no diarrhea. Patient reports intermittent lower abdominal cramping 5/10 for 1 week. No pain at this time.  History reviewed. No pertinent past medical history.  There are no active problems to display for this patient.   History reviewed. No pertinent surgical history.  Prior to Admission medications   Medication Sig Start Date End Date Taking? Authorizing Provider  Doxylamine-Pyridoxine 10-10 MG TBEC Take 2 tablets by mouth 3 (three) times daily. 10/30/17   Nita Sickle, MD  metoCLOPramide (REGLAN) 10 MG tablet Take 1 tablet (10 mg total) by mouth every 8 (eight) hours as needed for nausea or vomiting. 10/24/17 10/24/18  Phineas Semen, MD  nitrofurantoin, macrocrystal-monohydrate, (MACROBID) 100 MG capsule Take 1 capsule (100 mg total) by mouth 2 (two) times daily for 5 days. 10/30/17 11/04/17  Nita Sickle, MD    Allergies Patient has no known allergies.  No family history on file.  Social History Social History   Tobacco Use  . Smoking status: Former Games developer  . Smokeless tobacco: Never Used  Substance Use Topics  . Alcohol use: No  . Drug use: No    Review of Systems  Constitutional: Negative for fever. Eyes: Negative for visual changes. ENT: Negative for sore throat. Neck: No neck pain    Cardiovascular: Negative for chest pain. Respiratory: Negative for shortness of breath. Gastrointestinal: + lower abdominal cramping, nausea, and vomiting. Negative for diarrhea. Genitourinary: Negative for dysuria. Musculoskeletal: Negative for back pain. Skin: Negative for rash. Neurological: Negative for headaches, weakness or numbness. Psych: No SI or HI  ____________________________________________   PHYSICAL EXAM:  VITAL SIGNS: ED Triage Vitals  Enc Vitals Group     BP 10/30/17 0843 123/77     Pulse Rate 10/30/17 0843 69     Resp 10/30/17 0843 16     Temp 10/30/17 0843 98.4 F (36.9 C)     Temp Source 10/30/17 0843 Oral     SpO2 10/30/17 0843 99 %     Weight 10/30/17 0844 140 lb (63.5 kg)     Height 10/30/17 0844 5\' 5"  (1.651 m)     Head Circumference --      Peak Flow --      Pain Score 10/30/17 0843 4     Pain Loc --      Pain Edu? --      Excl. in GC? --     Constitutional: Alert and oriented. Well appearing and in no apparent distress. HEENT:      Head: Normocephalic and atraumatic.         Eyes: Conjunctivae are normal. Sclera is non-icteric.       Mouth/Throat: Mucous membranes are moist.       Neck: Supple with no signs of meningismus. Cardiovascular: Regular rate and rhythm. No murmurs, gallops, or rubs. 2+ symmetrical distal pulses are present in all extremities.  No JVD. Respiratory: Normal respiratory effort. Lungs are clear to auscultation bilaterally. No wheezes, crackles, or rhonchi.  Gastrointestinal: Soft, non tender, and non distended with positive bowel sounds. No rebound or guarding. Genitourinary: No CVA tenderness. Musculoskeletal: Nontender with normal range of motion in all extremities. No edema, cyanosis, or erythema of extremities. Neurologic: Normal speech and language. Face is symmetric. Moving all extremities. No gross focal neurologic deficits are appreciated. Skin: Skin is warm, dry and intact. No rash noted. Psychiatric: Mood and  affect are normal. Speech and behavior are normal.  ____________________________________________   LABS (all labs ordered are listed, but only abnormal results are displayed)  Labs Reviewed  COMPREHENSIVE METABOLIC PANEL - Abnormal; Notable for the following components:      Result Value   Sodium 132 (*)    Potassium 3.2 (*)    CO2 21 (*)    ALT 13 (*)    All other components within normal limits  URINALYSIS, COMPLETE (UACMP) WITH MICROSCOPIC - Abnormal; Notable for the following components:   Color, Urine AMBER (*)    APPearance CLOUDY (*)    Specific Gravity, Urine 1.032 (*)    Ketones, ur 80 (*)    Protein, ur 100 (*)    Leukocytes, UA TRACE (*)    Bacteria, UA RARE (*)    Squamous Epithelial / LPF 6-30 (*)    All other components within normal limits  LIPASE, BLOOD  CBC   ____________________________________________  EKG  none  ____________________________________________  RADIOLOGY  none  ____________________________________________   PROCEDURES  Procedure(s) performed: None Procedures Critical Care performed:  None ____________________________________________   INITIAL IMPRESSION / ASSESSMENT AND PLAN / ED COURSE   26 y.o. female G1P0 at 1847w6d gestational age who presents for evaluation of nausea and vomiting x 1 week. Patient is well appearing, normal vitals, abdomen is soft, non distended and no tenderness to palpation. Patient recently found out she was pregnant and underwent a US here 6 days ago showing normal IUP. US also showed L ovarian cyst. Discussed with patient signs and symptoms of torsion but with no pain and no LLQ ttp on exam, no clinical suspicion of torsion. NO clinical suspicion of SBO with no pain, no distention, passing flatus and normal daily BMs. Will give IVF and IV reglan. Will start patient on doxylamine and pyridoxine. Will check UA to rule out UTI and to evaluate for ketones.     _________________________ 12:34 PM on  10/30/2017 -----------------------------------------  Patient received 1L of NS and 1L of D5NS and feels markedly improved and is requesting discharge. Patient has rx for reglan will provide rx for pyridoxine and doxylamine. Recommended close f/u with OBGYN. Discussed return precautions. Labs showing bacteriuria for which she was started on macrobid and ketones. No other acute findings.    As part of my medical decision making, I reviewed the following data within the electronic MEDICAL RECORD NUMBER Nursing notes reviewed and incorporated, Labs reviewed , Notes from prior ED visits and [place] Controlled Substance Database    Pertinent labs & imaging results that were available during my care of the patient were reviewed by me and considered in my medical decision making (see chart for details).    ____________________________________________   FINAL CLINICAL IMPRESSION(S) / ED DIAGNOSES  Final diagnoses:  Nausea and vomiting in pregnancy  Bacteriuria, asymptomatic in pregnancy      NEW MEDICATIONS STARTED DURING THIS VISIT:  ED Discharge Orders        Ordered  Doxylamine-Pyridoxine 10-10 MG TBEC  3 times daily     10/30/17 1234    nitrofurantoin, macrocrystal-monohydrate, (MACROBID) 100 MG capsule  2 times daily     10/30/17 1236       Note:  This document was prepared using Dragon voice recognition software and may include unintentional dictation errors.    Nita SickleVeronese, [place], MD 10/30/17 [phone]

## 2017-11-24 NOTE — L&D Delivery Note (Signed)
Delivery Note At 8:02 AM a viable and healthy female was delivered via  (Presentation: right occiput; anterior ).  APGAR: 9, 9; weight pending .   Placenta status: spontaneous, intact.  Cord: 3V with loose nuchal x 1  Anesthesia:  Epidural  Episiotomy: none  Lacerations: 2nd  Suture Repair: 3.0 vicryl Est. Blood Loss (mL):  200 cc  Mom to postpartum.  Baby to Couplet care / Skin to Skin.  Waynard ReedsKendra Deno Sida 06/05/2018, 8:23 AM

## 2017-12-07 LAB — OB RESULTS CONSOLE RUBELLA ANTIBODY, IGM: RUBELLA: IMMUNE

## 2017-12-07 LAB — OB RESULTS CONSOLE HEPATITIS B SURFACE ANTIGEN: HEP B S AG: NEGATIVE

## 2017-12-07 LAB — OB RESULTS CONSOLE HIV ANTIBODY (ROUTINE TESTING): HIV: NONREACTIVE

## 2017-12-25 ENCOUNTER — Inpatient Hospital Stay (HOSPITAL_COMMUNITY)
Admission: AD | Admit: 2017-12-25 | Discharge: 2017-12-25 | Disposition: A | Discharge status: Home / Self Care | Payer: Federal, State, Local not specified - PPO | Source: Ambulatory Visit | Attending: Obstetrics and Gynecology | Admitting: Obstetrics and Gynecology

## 2017-12-25 ENCOUNTER — Encounter (HOSPITAL_COMMUNITY): Payer: Self-pay

## 2017-12-25 DIAGNOSIS — O211 Hyperemesis gravidarum with metabolic disturbance: Secondary | ICD-10-CM | POA: Diagnosis not present

## 2017-12-25 DIAGNOSIS — O210 Mild hyperemesis gravidarum: Secondary | ICD-10-CM

## 2017-12-25 DIAGNOSIS — Z87891 Personal history of nicotine dependence: Secondary | ICD-10-CM | POA: Diagnosis not present

## 2017-12-25 DIAGNOSIS — E860 Dehydration: Secondary | ICD-10-CM

## 2017-12-25 DIAGNOSIS — Z3A15 15 weeks gestation of pregnancy: Secondary | ICD-10-CM | POA: Diagnosis not present

## 2017-12-25 HISTORY — DX: Other specified health status: Z78.9

## 2017-12-25 LAB — URINALYSIS, ROUTINE W REFLEX MICROSCOPIC
BILIRUBIN URINE: NEGATIVE
Glucose, UA: NEGATIVE mg/dL
HGB URINE DIPSTICK: NEGATIVE
Ketones, ur: 80 mg/dL — AB
LEUKOCYTES UA: NEGATIVE
NITRITE: NEGATIVE
Protein, ur: 100 mg/dL — AB
Specific Gravity, Urine: 1.03 (ref 1.005–1.030)
pH: 5 (ref 5.0–8.0)

## 2017-12-25 LAB — COMPREHENSIVE METABOLIC PANEL
ALBUMIN: 4.2 g/dL (ref 3.5–5.0)
ALT: 12 U/L — AB (ref 14–54)
AST: 20 U/L (ref 15–41)
Alkaline Phosphatase: 50 U/L (ref 38–126)
Anion gap: 12 (ref 5–15)
BUN: 7 mg/dL (ref 6–20)
CHLORIDE: 104 mmol/L (ref 101–111)
CO2: 20 mmol/L — AB (ref 22–32)
CREATININE: 0.54 mg/dL (ref 0.44–1.00)
Calcium: 9.7 mg/dL (ref 8.9–10.3)
GFR calc Af Amer: >60 mL/min (ref >60)
GFR calc non Af Amer: >60 mL/min (ref >60)
GLUCOSE: 94 mg/dL (ref 65–99)
Potassium: 3.9 mmol/L (ref 3.5–5.1)
SODIUM: 136 mmol/L (ref 135–145)
Total Bilirubin: 1.2 mg/dL (ref 0.3–1.2)
Total Protein: 7.4 g/dL (ref 6.5–8.1)

## 2017-12-25 LAB — CBC
HCT: 39.9 % (ref 36.0–46.0)
HEMOGLOBIN: 14 g/dL (ref 12.0–15.0)
MCH: 30.2 pg (ref 26.0–34.0)
MCHC: 35.1 g/dL (ref 30.0–36.0)
MCV: 86 fL (ref 78.0–100.0)
PLATELETS: 280 10*3/uL (ref 150–400)
RBC: 4.64 MIL/uL (ref 3.87–5.11)
RDW: 13.5 % (ref 11.5–15.5)
WBC: 7.1 10*3/uL (ref 4.0–10.5)

## 2017-12-25 LAB — RAPID URINE DRUG SCREEN, HOSP PERFORMED
Amphetamines: NOT DETECTED
BARBITURATES: NOT DETECTED
Benzodiazepines: NOT DETECTED
COCAINE: NOT DETECTED
Opiates: NOT DETECTED
TETRAHYDROCANNABINOL: POSITIVE — AB

## 2017-12-25 MED ORDER — LACTATED RINGERS IV SOLN
Freq: Once | INTRAVENOUS | Status: AC
  Administered 2017-12-25: 12:00:00 via INTRAVENOUS
  Filled 2017-12-25: qty 10

## 2017-12-25 MED ORDER — LACTATED RINGERS IV BOLUS (SEPSIS)
1000.0000 mL | Freq: Once | INTRAVENOUS | Status: AC
  Administered 2017-12-25: 1000 mL via INTRAVENOUS

## 2017-12-25 MED ORDER — METOCLOPRAMIDE HCL 10 MG PO TABS: 10.0000 mg | ORAL_TABLET | Freq: Four times a day (QID) | ORAL | 0 refills | Status: DC | PRN

## 2017-12-25 MED ORDER — METOCLOPRAMIDE HCL 5 MG/ML IJ SOLN
10.0000 mg | Freq: Once | INTRAMUSCULAR | Status: AC
  Administered 2017-12-25: 10 mg via INTRAVENOUS
  Filled 2017-12-25: qty 2

## 2017-12-25 NOTE — Discharge Instructions (Signed)
Eating Plan for Hyperemesis Gravidarum °Hyperemesis gravidarum is a severe form of morning sickness. Because this condition causes severe nausea and vomiting, it can lead to dehydration, malnutrition, and weight loss. One way to lessen the symptoms of nausea and vomiting is to follow the eating plan for hyperemesis gravidarum. It is often used along with prescribed medicines to control your symptoms. °What can I do to relieve my symptoms? °Listen to your body. Everyone is different and has different preferences. Find what works best for you. Take any of the following actions that are helpful to you: °· Eat and drink slowly. °· Eat 5-6 small meals daily instead of 3 large meals. °· Eat crackers before you get out of bed in the morning. °· Try having a snack in the middle of the night. °· Starchy foods are usually tolerated well. Examples include cereal, toast, bread, potatoes, pasta, rice, and pretzels. °· Ginger may help with nausea. Add ¼ tsp ground ginger to hot tea or choose ginger tea. °· Try drinking 100% fruit juice or an electrolyte drink. An electrolyte drink contains sodium, potassium, and chloride. °· Continue to take your prenatal vitamins as told by your health care provider. If you are having trouble taking your prenatal vitamins, talk with your health care provider about different options. °· Include at least 1 serving of protein with your meals and snacks. Protein options include meats or poultry, beans, nuts, eggs, and yogurt. Try eating a protein-rich snack before bed. Examples of these snacks include cheese and crackers or half of a peanut butter or turkey sandwich. °· Consider eliminating foods that trigger your symptoms. These may include spicy foods, coffee, high-fat foods, very sweet foods, and acidic foods. °· Try meals that have more protein combined with bland, salty, lower-fat, and dry foods, such as nuts, seeds, pretzels, crackers, and cereal. °· Talk with your healthcare provider about  starting a supplement of vitamin B6. °· Have fluids that are cold, clear, and carbonated or sour. Examples include lemonade, ginger ale, lemon-lime soda, ice water, and sparkling water. °· Try lemon or mint tea. °· Try brushing your teeth or using a mouth rinse after meals. ° °What should I avoid to reduce my symptoms? °Avoiding some of the following things may help reduce your symptoms. °· Foods with strong smells. Try eating meals in well-ventilated areas that are free of odors. °· Drinking water or other beverages with meals. Try not to drink anything during the 30 minutes before and after your meals. °· Drinking more than 1 cup of fluid at a time. Sometimes using a straw helps. °· Fried or high-fat foods, such as butter and cream sauces. °· Spicy foods. °· Skipping meals as best as you can. Nausea can be more intense on an empty stomach. If you cannot tolerate food at that time, do not force it. Try sucking on ice chips or other frozen items, and make up for missed calories later. °· Lying down within 2 hours after eating. °· Environmental triggers. These may include smoky rooms, closed spaces, rooms with strong smells, warm or humid places, overly loud and noisy rooms, and rooms with motion or flickering lights. °· Quick and sudden changes in your movement. ° °This information is not intended to replace advice given to you by your health care provider. Make sure you discuss any questions you have with your health care provider. °Document Released: 09/07/2007 Document Revised: 07/09/2016 Document Reviewed: 06/10/2016 °Elsevier Interactive Patient Education © 2018 [employer] Inc. °Morning Sickness °Morning sickness   is when you feel sick to your stomach (nauseous) during pregnancy. You may feel sick to your stomach and throw up (vomit). You may feel sick in the morning, but you can feel this way any time of day. Some women feel very sick to their stomach and cannot stop throwing up (hyperemesis gravidarum). Follow  these instructions at home:  Only take medicines as told by your doctor.  Take multivitamins as told by your doctor. Taking multivitamins before getting pregnant can stop or lessen the harshness of morning sickness.  Eat dry toast or unsalted crackers before getting out of bed.  Eat 5 to 6 small meals a day.  Eat dry and bland foods like rice and baked potatoes.  Do not drink liquids with meals. Drink between meals.  Do not eat greasy, fatty, or spicy foods.  Have someone cook for you if the smell of food causes you to feel sick or throw up.  If you feel sick to your stomach after taking prenatal vitamins, take them at night or with a snack.  Eat protein when you need a snack (nuts, yogurt, cheese).  Eat unsweetened gelatins for dessert.  Wear a bracelet used for sea sickness (acupressure wristband).  Go to a doctor that puts thin needles into certain body points (acupuncture) to improve how you feel.  Do not smoke.  Use a humidifier to keep the air in your house free of odors.  Get lots of fresh air. Contact a doctor if:  You need medicine to feel better.  You feel dizzy or lightheaded.  You are losing weight. Get help right away if:  You feel very sick to your stomach and cannot stop throwing up.  You pass out (faint). This information is not intended to replace advice given to you by your health care provider. Make sure you discuss any questions you have with your health care provider. Document Released: 12/18/2004 Document Revised: 04/17/2016 Document Reviewed: 04/27/2013 Elsevier Interactive Patient Education  2017 Elsevier Inc.   Unisom 1 tablet at bedtime Zantac 150 mg 1 tablet at bedtime B6 50 mg 1 tab four times daily

## 2017-12-25 NOTE — MAU Note (Signed)
PO challenge with gingerale and crackers.

## 2017-12-25 NOTE — MAU Note (Addendum)
Pt is having worsening n/v and has been taking phenergan. No bleeding or LOF. Pt states she hasn't had a BM in two weeks.

## 2017-12-25 NOTE — MAU Provider Note (Signed)
History     CSN: [id number]  Arrival date and time: 12/25/17 [id number]   First Provider Initiated Contact with Patient 12/25/17 1030      Chief Complaint  Patient presents with  . Emesis   G1 @15 .4 wks here with N/V. Sx started in early December when she found out she was pregnant. Is unable to tolerate any food or fluids. Thinks she's lost 15 pounds over the last 2 months. She has been using Phenergan but it only makes her sleepy, doesn't control vomiting. No VB or pain.   OB History    Gravida Para Term Preterm AB Living   1             SAB TAB Ectopic Multiple Live Births                  Past Medical History:  Diagnosis Date  . Medical history non-contributory     Past Surgical History:  Procedure Laterality Date  . NO PAST SURGERIES      History reviewed. No pertinent family history.  Social History   Tobacco Use  . Smoking status: Former Games developermoker  . Smokeless tobacco: Never Used  Substance Use Topics  . Alcohol use: No  . Drug use: No    Allergies: No Known Allergies  No medications prior to admission.    Review of Systems  Constitutional: Negative for fever.  Gastrointestinal: Positive for constipation, nausea and vomiting. Negative for abdominal pain.  Genitourinary: Negative for vaginal bleeding.  Neurological: Positive for weakness and light-headedness.   Physical Exam   Blood pressure 106/76, pulse 91, temperature (!) 97.4 F (36.3 C), resp. rate 16, height 5\' 6"  (1.676 m), weight 122 lb (55.3 kg), last menstrual period 09/16/2017, SpO2 100 %.  Physical Exam  Nursing note and vitals reviewed. Constitutional: She is oriented to person, place, and time. She appears well-developed and well-nourished. No distress.  HENT:  Head: Normocephalic and atraumatic.  Neck: Normal range of motion.  Cardiovascular: Normal rate.  Respiratory: Effort normal. No respiratory distress.  Musculoskeletal: Normal range of motion.  Neurological: She is alert and  oriented to person, place, and time.  Skin: Skin is warm and dry.  Psychiatric: She has a normal mood and affect.  FHT 161  Results for orders placed or performed during the hospital encounter of 12/25/17 (from the past 24 hour(s))  Urinalysis, Routine w reflex microscopic     Status: Abnormal   Collection Time: 12/25/17 10:09 AM  Result Value Ref Range   Color, Urine YELLOW YELLOW   APPearance HAZY (A) CLEAR   Specific Gravity, Urine 1.030 1.005 - 1.030   pH 5.0 5.0 - 8.0   Glucose, UA NEGATIVE NEGATIVE mg/dL   Hgb urine dipstick NEGATIVE NEGATIVE   Bilirubin Urine NEGATIVE NEGATIVE   Ketones, ur 80 (A) NEGATIVE mg/dL   Protein, ur 119100 (A) NEGATIVE mg/dL   Nitrite NEGATIVE NEGATIVE   Leukocytes, UA NEGATIVE NEGATIVE   RBC / HPF 0-5 0 - 5 RBC/hpf   WBC, UA 0-5 0 - 5 WBC/hpf   Bacteria, UA RARE (A) NONE SEEN   Squamous Epithelial / LPF 0-5 (A) NONE SEEN   Mucus PRESENT   Urine rapid drug screen (hosp performed)     Status: Abnormal   Collection Time: 12/25/17 10:09 AM  Result Value Ref Range   Opiates NONE DETECTED NONE DETECTED   Cocaine NONE DETECTED NONE DETECTED   Benzodiazepines NONE DETECTED NONE DETECTED   Amphetamines NONE DETECTED NONE  DETECTED   Tetrahydrocannabinol POSITIVE (A) NONE DETECTED   Barbiturates NONE DETECTED NONE DETECTED  CBC     Status: None   Collection Time: 12/25/17 10:16 AM  Result Value Ref Range   WBC 7.1 4.0 - 10.5 K/uL   RBC 4.64 3.87 - 5.11 MIL/uL   Hemoglobin 14.0 12.0 - 15.0 g/dL   HCT 16.1 09.6 - 04.5 %   MCV 86.0 78.0 - 100.0 fL   MCH 30.2 26.0 - 34.0 pg   MCHC 35.1 30.0 - 36.0 g/dL   RDW 40.9 81.1 - 91.4 %   Platelets 280 150 - 400 K/uL  Comprehensive metabolic panel     Status: Abnormal   Collection Time: 12/25/17 10:16 AM  Result Value Ref Range   Sodium 136 135 - 145 mmol/L   Potassium 3.9 3.5 - 5.1 mmol/L   Chloride 104 101 - 111 mmol/L   CO2 20 (L) 22 - 32 mmol/L   Glucose, Bld 94 65 - 99 mg/dL   BUN 7 6 - 20 mg/dL    Creatinine, Ser 7.82 0.44 - 1.00 mg/dL   Calcium 9.7 8.9 - 95.6 mg/dL   Total Protein 7.4 6.5 - 8.1 g/dL   Albumin 4.2 3.5 - 5.0 g/dL   AST 20 15 - 41 U/L   ALT 12 (L) 14 - 54 U/L   Alkaline Phosphatase 50 38 - 126 U/L   Total Bilirubin 1.2 0.3 - 1.2 mg/dL   GFR calc non Af Amer >60 >60 mL/min   GFR calc Af Amer >60 >60 mL/min   Anion gap 12 5 - 15   MAU Course  Procedures LR MTV Reglan  MDM Labs ordered and reviewed. UDS ordered d/t pt restless in the bed. +THC. Had denied use earlier when first interviewed. Now admits to use of THC oil to help with N/V. Denies smoking marijuana. Recommend d/c THC as this could be worsening sx. Tolerating po after Reglan. Also recommend B6 4 times a day, Unisom and Zantac at hs. Presentation, clinical findings, and plan discussed with Dr. Dareen Piano. Stable for discharge home. .   Assessment and Plan   1. [redacted] weeks gestation of pregnancy   2. Hyperemesis gravidarum   3. Dehydration    Discharge home Rx Reglan Unisom B6 Zantac HEG diet  Allergies as of 12/25/2017   No Known Allergies     Medication List    STOP taking these medications   Doxylamine-Pyridoxine 10-10 MG Tbec   VITAMIN B6 PO     TAKE these medications   metoCLOPramide 10 MG tablet Commonly known as:  REGLAN Take 1 tablet (10 mg total) by mouth every 6 (six) hours as needed for nausea or vomiting. What changed:  when to take this       Donette Larry, CNM 12/25/2017, 1:23 PM

## 2018-02-28 ENCOUNTER — Other Ambulatory Visit: Payer: Self-pay

## 2018-02-28 ENCOUNTER — Inpatient Hospital Stay (HOSPITAL_COMMUNITY)
Admission: AD | Admit: 2018-02-28 | Discharge: 2018-03-01 | Disposition: A | Discharge status: Home / Self Care | Payer: Federal, State, Local not specified - PPO | Source: Ambulatory Visit | Attending: Obstetrics and Gynecology | Admitting: Obstetrics and Gynecology

## 2018-02-28 ENCOUNTER — Encounter (HOSPITAL_COMMUNITY): Payer: Self-pay | Admitting: *Deleted

## 2018-02-28 DIAGNOSIS — Z3A25 25 weeks gestation of pregnancy: Secondary | ICD-10-CM | POA: Diagnosis not present

## 2018-02-28 DIAGNOSIS — O26892 Other specified pregnancy related conditions, second trimester: Secondary | ICD-10-CM | POA: Diagnosis not present

## 2018-02-28 DIAGNOSIS — Z87891 Personal history of nicotine dependence: Secondary | ICD-10-CM | POA: Diagnosis not present

## 2018-02-28 DIAGNOSIS — R109 Unspecified abdominal pain: Secondary | ICD-10-CM | POA: Diagnosis not present

## 2018-02-28 NOTE — MAU Note (Signed)
Pt reports that she woke up with sharp pains in her back and stomach. Pain is on both sides. Denies recent intercourse. Denies STD's. + FM. Denies bleeding or leaking. Applied last dose of cream for BV this evening.

## 2018-03-01 ENCOUNTER — Encounter (HOSPITAL_COMMUNITY): Payer: Self-pay | Admitting: *Deleted

## 2018-03-01 DIAGNOSIS — O26892 Other specified pregnancy related conditions, second trimester: Secondary | ICD-10-CM

## 2018-03-01 DIAGNOSIS — R109 Unspecified abdominal pain: Secondary | ICD-10-CM

## 2018-03-01 LAB — URINALYSIS, ROUTINE W REFLEX MICROSCOPIC
BILIRUBIN URINE: NEGATIVE
Glucose, UA: NEGATIVE mg/dL
HGB URINE DIPSTICK: NEGATIVE
KETONES UR: NEGATIVE mg/dL
Leukocytes, UA: NEGATIVE
Nitrite: NEGATIVE
PROTEIN: NEGATIVE mg/dL
Specific Gravity, Urine: 1.024 (ref 1.005–1.030)
pH: 6 (ref 5.0–8.0)

## 2018-03-01 NOTE — MAU Provider Note (Signed)
History     CSN: [id number]  Arrival date and time: 02/28/18 2341   First Provider Initiated Contact with Patient 03/01/18 0035      Chief Complaint  Patient presents with  . Abdominal Pain  . Back Pain   HPI Rheanne Cortopassi is a 27 y.o. G1P0 at [redacted]w[redacted]d who presents with abdominal pain. States she woke up to sharp pain that went down bilateral sides into her low abdomen. Pain was intermittent like cramping that lasted for 20 minutes. Felt pain once more just prior to coming to MAU. Has not felt pain since being here. Denies n/v/d, constipation, vaginal bleeding, LOF, or recent intercourse. Positive fetal movement. Used vaginal medication for BV tonight.   OB History    Gravida  1   Para      Term      Preterm      AB      Living        SAB      TAB      Ectopic      Multiple      Live Births              Past Medical History:  Diagnosis Date  . Medical history non-contributory     Past Surgical History:  Procedure Laterality Date  . NO PAST SURGERIES      History reviewed. No pertinent family history.  Social History   Tobacco Use  . Smoking status: Former Games developer  . Smokeless tobacco: Never Used  Substance Use Topics  . Alcohol use: No  . Drug use: No    Allergies: No Known Allergies  Medications Prior to Admission  Medication Sig Dispense Refill Last Dose  . metoCLOPramide (REGLAN) 10 MG tablet Take 1 tablet (10 mg total) by mouth every 6 (six) hours as needed for nausea or vomiting. 30 tablet 0   . Pyridoxine HCl (VITAMIN B6 PO) Take 1 tablet by mouth daily.   12/24/2017 at Unknown time    Review of Systems  Constitutional: Negative.   Gastrointestinal: Positive for abdominal pain. Negative for diarrhea, nausea and vomiting.   Physical Exam   Blood pressure 116/73, pulse 89, temperature 98.2 F (36.8 C), resp. rate 18, last menstrual period 09/16/2017, SpO2 100 %.  Physical Exam  Nursing note and vitals reviewed. Constitutional: She  is oriented to person, place, and time. She appears well-developed and well-nourished. No distress.  HENT:  Head: Normocephalic and atraumatic.  Eyes: Conjunctivae are normal. Right eye exhibits no discharge. Left eye exhibits no discharge. No scleral icterus.  Neck: Normal range of motion.  Respiratory: Effort normal. No respiratory distress.  GI: Soft. There is no tenderness.  Genitourinary:  Genitourinary Comments: Dilation: Closed Effacement (%): Thick Cervical Position: Posterior Exam by:: Estanislado Spire NP   Neurological: She is alert and oriented to person, place, and time.  Skin: Skin is warm and dry. She is not diaphoretic.  Psychiatric: She has a normal mood and affect. Her behavior is normal. Judgment and thought content normal.    MAU Course  Procedures Results for orders placed or performed during the hospital encounter of 02/28/18 (from the past 24 hour(s))  Urinalysis, Routine w reflex microscopic     Status: None   Collection Time: 02/28/18 11:57 PM  Result Value Ref Range   Color, Urine YELLOW YELLOW   APPearance CLEAR CLEAR   Specific Gravity, Urine 1.024 1.005 - 1.030   pH 6.0 5.0 - 8.0   Glucose, UA  NEGATIVE NEGATIVE mg/dL   Hgb urine dipstick NEGATIVE NEGATIVE   Bilirubin Urine NEGATIVE NEGATIVE   Ketones, ur NEGATIVE NEGATIVE mg/dL   Protein, ur NEGATIVE NEGATIVE mg/dL   Nitrite NEGATIVE NEGATIVE   Leukocytes, UA NEGATIVE NEGATIVE    MDM NST:  Baseline: 140 bpm, Variability: Good {> 6 bpm), Accelerations: Non-reactive but appropriate for gestational age and Decelerations: Absent Fetal tracing appropriate for gestation. Abdomen soft & non tender. Cervix closed/thick. U/a negative. Pt reassured.   C/w Dr. Henderson CloudHorvath. Ok to discharge home.   Assessment and Plan  A:  1. Abdominal pain during pregnancy in second trimester   2. [redacted] weeks gestation of pregnancy    P: Discharge home PTL precautions Keep f/u with OB  Judeth HornErin Anntonette Madewell 03/01/2018, 12:35 AM

## 2018-03-01 NOTE — Discharge Instructions (Signed)

## 2018-03-15 ENCOUNTER — Observation Stay (HOSPITAL_COMMUNITY)
Admission: AD | Admit: 2018-03-15 | Discharge: 2018-03-16 | Disposition: A | Discharge status: Home / Self Care | Payer: Federal, State, Local not specified - PPO | Source: Ambulatory Visit | Attending: Obstetrics and Gynecology | Admitting: Obstetrics and Gynecology

## 2018-03-15 ENCOUNTER — Encounter (HOSPITAL_COMMUNITY): Payer: Self-pay

## 2018-03-15 DIAGNOSIS — R1030 Lower abdominal pain, unspecified: Secondary | ICD-10-CM | POA: Diagnosis not present

## 2018-03-15 DIAGNOSIS — Z87891 Personal history of nicotine dependence: Secondary | ICD-10-CM | POA: Insufficient documentation

## 2018-03-15 DIAGNOSIS — Z3A27 27 weeks gestation of pregnancy: Secondary | ICD-10-CM | POA: Diagnosis not present

## 2018-03-15 DIAGNOSIS — Y999 Unspecified external cause status: Secondary | ICD-10-CM | POA: Insufficient documentation

## 2018-03-15 DIAGNOSIS — Y92410 Unspecified street and highway as the place of occurrence of the external cause: Secondary | ICD-10-CM | POA: Insufficient documentation

## 2018-03-15 DIAGNOSIS — Z79899 Other long term (current) drug therapy: Secondary | ICD-10-CM | POA: Diagnosis not present

## 2018-03-15 DIAGNOSIS — Y9389 Activity, other specified: Secondary | ICD-10-CM | POA: Diagnosis not present

## 2018-03-15 DIAGNOSIS — M545 Low back pain: Secondary | ICD-10-CM | POA: Diagnosis not present

## 2018-03-15 DIAGNOSIS — O9989 Other specified diseases and conditions complicating pregnancy, childbirth and the puerperium: Principal | ICD-10-CM | POA: Insufficient documentation

## 2018-03-15 DIAGNOSIS — Z3A26 26 weeks gestation of pregnancy: Secondary | ICD-10-CM

## 2018-03-15 HISTORY — DX: Scoliosis, unspecified: M41.9

## 2018-03-15 LAB — URINALYSIS, ROUTINE W REFLEX MICROSCOPIC
Bilirubin Urine: NEGATIVE
GLUCOSE, UA: NEGATIVE mg/dL
Hgb urine dipstick: NEGATIVE
Ketones, ur: NEGATIVE mg/dL
LEUKOCYTES UA: NEGATIVE
NITRITE: NEGATIVE
PH: 6 (ref 5.0–8.0)
Protein, ur: NEGATIVE mg/dL
SPECIFIC GRAVITY, URINE: 1.008 (ref 1.005–1.030)

## 2018-03-15 LAB — CBC WITH DIFFERENTIAL/PLATELET
BASOS PCT: 0 %
Basophils Absolute: 0 10*3/uL (ref 0.0–0.1)
Eosinophils Absolute: 0.2 10*3/uL (ref 0.0–0.7)
Eosinophils Relative: 2 %
HEMATOCRIT: 34.1 % — AB (ref 36.0–46.0)
HEMOGLOBIN: 11.4 g/dL — AB (ref 12.0–15.0)
LYMPHS PCT: 33 %
Lymphs Abs: 3 10*3/uL (ref 0.7–4.0)
MCH: 28.9 pg (ref 26.0–34.0)
MCHC: 33.4 g/dL (ref 30.0–36.0)
MCV: 86.5 fL (ref 78.0–100.0)
MONOS PCT: 11 %
Monocytes Absolute: 1 10*3/uL (ref 0.1–1.0)
NEUTROS ABS: 4.8 10*3/uL (ref 1.7–7.7)
NEUTROS PCT: 54 %
Platelets: 264 10*3/uL (ref 150–400)
RBC: 3.94 MIL/uL (ref 3.87–5.11)
RDW: 13.4 % (ref 11.5–15.5)
WBC: 8.9 10*3/uL (ref 4.0–10.5)

## 2018-03-15 MED ORDER — ACETAMINOPHEN 325 MG PO TABS
650.0000 mg | ORAL_TABLET | Freq: Once | ORAL | Status: AC
  Administered 2018-03-15: 650 mg via ORAL
  Filled 2018-03-15: qty 2
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
## 2018-03-16 ENCOUNTER — Other Ambulatory Visit: Payer: Self-pay

## 2018-03-16 ENCOUNTER — Encounter (HOSPITAL_COMMUNITY): Payer: Self-pay

## 2018-03-16 DIAGNOSIS — Y999 Unspecified external cause status: Secondary | ICD-10-CM | POA: Diagnosis not present

## 2018-03-16 DIAGNOSIS — Y9389 Activity, other specified: Secondary | ICD-10-CM | POA: Diagnosis not present

## 2018-03-16 DIAGNOSIS — R1030 Lower abdominal pain, unspecified: Secondary | ICD-10-CM | POA: Diagnosis not present

## 2018-03-16 DIAGNOSIS — Z79899 Other long term (current) drug therapy: Secondary | ICD-10-CM | POA: Diagnosis not present

## 2018-03-16 DIAGNOSIS — Z3A27 27 weeks gestation of pregnancy: Secondary | ICD-10-CM | POA: Diagnosis not present

## 2018-03-16 DIAGNOSIS — O9989 Other specified diseases and conditions complicating pregnancy, childbirth and the puerperium: Secondary | ICD-10-CM | POA: Diagnosis present

## 2018-03-16 DIAGNOSIS — Y92410 Unspecified street and highway as the place of occurrence of the external cause: Secondary | ICD-10-CM | POA: Diagnosis not present

## 2018-03-16 DIAGNOSIS — M545 Low back pain: Secondary | ICD-10-CM | POA: Diagnosis not present

## 2018-03-16 DIAGNOSIS — Z87891 Personal history of nicotine dependence: Secondary | ICD-10-CM | POA: Diagnosis not present

## 2018-03-16 LAB — BASIC METABOLIC PANEL
ANION GAP: 10 (ref 5–15)
BUN: 6 mg/dL (ref 6–20)
CHLORIDE: 104 mmol/L (ref 101–111)
CO2: 23 mmol/L (ref 22–32)
Calcium: 9.2 mg/dL (ref 8.9–10.3)
Creatinine, Ser: 0.52 mg/dL (ref 0.44–1.00)
GFR calc Af Amer: >60 mL/min (ref >60)
Glucose, Bld: 84 mg/dL (ref 65–99)
POTASSIUM: 3.7 mmol/L (ref 3.5–5.1)
Sodium: 137 mmol/L (ref 135–145)

## 2018-03-16 LAB — HEPATIC FUNCTION PANEL
ALT: 10 U/L — ABNORMAL LOW (ref 14–54)
AST: 16 U/L (ref 15–41)
Albumin: 2.8 g/dL — ABNORMAL LOW (ref 3.5–5.0)
Alkaline Phosphatase: 66 U/L (ref 38–126)
Total Bilirubin: 0.6 mg/dL (ref 0.3–1.2)
Total Protein: 5.9 g/dL — ABNORMAL LOW (ref 6.5–8.1)

## 2018-03-16 LAB — LIPASE, BLOOD: Lipase: 39 U/L (ref 11–51)
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
## 2018-03-16 NOTE — Progress Notes (Signed)
RN reviewed discharge instructions and follow-up care with pt using teach-back method. Pt verbalized understanding. Vitals WDL. Pt discharged via wheelchair with assistance of NT and accompanied by family.

## 2018-03-18 LAB — OB RESULTS CONSOLE RPR: RPR: NONREACTIVE

## 2018-03-21 ENCOUNTER — Other Ambulatory Visit: Payer: Self-pay

## 2018-03-21 ENCOUNTER — Inpatient Hospital Stay (HOSPITAL_COMMUNITY)
Admission: AD | Admit: 2018-03-21 | Discharge: 2018-03-21 | Disposition: A | Discharge status: Home / Self Care | Payer: Federal, State, Local not specified - PPO | Source: Ambulatory Visit | Attending: Obstetrics and Gynecology | Admitting: Obstetrics and Gynecology

## 2018-03-21 ENCOUNTER — Encounter (HOSPITAL_COMMUNITY): Payer: Self-pay | Admitting: *Deleted

## 2018-03-21 DIAGNOSIS — Z3A27 27 weeks gestation of pregnancy: Secondary | ICD-10-CM

## 2018-03-21 DIAGNOSIS — Q512 Other doubling of uterus, unspecified: Secondary | ICD-10-CM | POA: Diagnosis not present

## 2018-03-21 DIAGNOSIS — O3402 Maternal care for unspecified congenital malformation of uterus, second trimester: Secondary | ICD-10-CM | POA: Diagnosis not present

## 2018-03-21 DIAGNOSIS — O368120 Decreased fetal movements, second trimester, not applicable or unspecified: Secondary | ICD-10-CM

## 2018-03-21 DIAGNOSIS — Z3689 Encounter for other specified antenatal screening: Secondary | ICD-10-CM

## 2018-03-21 NOTE — MAU Provider Note (Signed)
  History     CSN: [id number]  Arrival date and time: 03/21/18 2029   First Provider Initiated Contact with Patient 03/21/18 2052      Chief Complaint  Patient presents with  . Decreased Fetal Movement   G1 .6 wks here with decreased FM. She reports not feeling movement since 1500 today. She also reports weaker fetal movements in general today. Denies VB, LOF, or ctx. She is eating and drinking well. 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  OB History    Gravida  1   Para      Term      Preterm      AB      Living        SAB      TAB      Ectopic      Multiple      Live Births              Past Medical History:  Diagnosis Date  . Medical history non-contributory   . Scoliosis     Past Surgical History:  Procedure Laterality Date  . NO PAST SURGERIES      Family History  Problem Relation Age of Onset  . Hypertension Mother   . Diabetes Maternal Grandmother   . Diabetes Maternal Grandfather     Social History   Tobacco Use  . Smoking status: Never Smoker  . Smokeless tobacco: Never Used  Substance Use Topics  . Alcohol use: No  . Drug use: No    Allergies: No Known Allergies  No medications prior to admission.    Review of Systems  Gastrointestinal: Negative for abdominal pain.  Genitourinary: Negative for vaginal bleeding and vaginal discharge.   Physical Exam   Blood pressure 115/73, pulse 93, temperature 98.5 F (36.9 C), resp. rate 18, last menstrual period 09/16/2017, SpO2 98 %.  Physical Exam  Constitutional: She is oriented to person, place, and time. She appears well-developed and well-nourished. No distress.  HENT:  Head: Normocephalic and atraumatic.  Neck: Normal range of motion.  Respiratory: Effort normal. No respiratory distress.  GI: Soft. She exhibits no distension. There is no tenderness.  gravid  Musculoskeletal: Normal range of motion.  Neurological: She is alert and oriented to person, place, and  time.  Skin: Skin is warm and dry.  Psychiatric: She has a normal mood and affect.  EFM: 150 bpm, mod variability, + accels, variable x1 decels Toco: none  No results found for this or any previous visit (from the past 24 hour(s)).  MAU Course  Procedures  MDM Pt reports good FM since arrival and feels like what she is used to. FM audible on EFM. Pt reassured. Presentation, clinical findings, and plan discussed with Dr. Claiborne Billings. Stable for discharge home.   Assessment and Plan   1. [redacted] weeks gestation of pregnancy   2. NST (non-stress test) reactive   3. Decreased fetal movements in second trimester, single or unspecified fetus    Discharge home Follow up in OB office this week as planned [facility]  Allergies as of 03/21/2018   No Known Allergies     Medication List    You have not been prescribed any medications.    Donette Larry, CNM 03/21/2018, 9:03 PM

## 2018-03-21 NOTE — MAU Note (Signed)
Pt reports not feeling the baby move since 3 pm. Now feel + FM in MAU

## 2018-03-21 NOTE — Discharge Instructions (Signed)

## 2018-04-28 ENCOUNTER — Encounter (HOSPITAL_COMMUNITY): Payer: Self-pay

## 2018-04-28 ENCOUNTER — Inpatient Hospital Stay (HOSPITAL_COMMUNITY)
Admission: AD | Admit: 2018-04-28 | Discharge: 2018-04-28 | Disposition: A | Discharge status: Home / Self Care | Payer: Federal, State, Local not specified - PPO | Source: Ambulatory Visit | Attending: Obstetrics and Gynecology | Admitting: Obstetrics and Gynecology

## 2018-04-28 DIAGNOSIS — O3403 Maternal care for unspecified congenital malformation of uterus, third trimester: Secondary | ICD-10-CM | POA: Insufficient documentation

## 2018-04-28 DIAGNOSIS — Q512 Other doubling of uterus, unspecified: Secondary | ICD-10-CM | POA: Insufficient documentation

## 2018-04-28 DIAGNOSIS — Z3A33 33 weeks gestation of pregnancy: Secondary | ICD-10-CM | POA: Diagnosis not present

## 2018-04-28 DIAGNOSIS — Z833 Family history of diabetes mellitus: Secondary | ICD-10-CM | POA: Diagnosis not present

## 2018-04-28 DIAGNOSIS — Z3689 Encounter for other specified antenatal screening: Secondary | ICD-10-CM

## 2018-04-28 DIAGNOSIS — O368130 Decreased fetal movements, third trimester, not applicable or unspecified: Secondary | ICD-10-CM

## 2018-04-28 DIAGNOSIS — Z8249 Family history of ischemic heart disease and other diseases of the circulatory system: Secondary | ICD-10-CM | POA: Insufficient documentation

## 2018-04-28 DIAGNOSIS — Z79899 Other long term (current) drug therapy: Secondary | ICD-10-CM | POA: Diagnosis not present

## 2018-04-28 LAB — URINALYSIS, ROUTINE W REFLEX MICROSCOPIC
BILIRUBIN URINE: NEGATIVE
Glucose, UA: 50 mg/dL — AB
Hgb urine dipstick: NEGATIVE
KETONES UR: NEGATIVE mg/dL
LEUKOCYTES UA: NEGATIVE
NITRITE: NEGATIVE
PH: 6 (ref 5.0–8.0)
Protein, ur: NEGATIVE mg/dL
Specific Gravity, Urine: 1.006 (ref 1.005–1.030)

## 2018-04-28 NOTE — Discharge Instructions (Signed)
Fetal Movement Counts °Patient Name: ________________________________________________ Patient Due Date: ____________________ °What is a fetal movement count? °A fetal movement count is the number of times that you feel your baby move during a certain amount of time. This may also be called a fetal kick count. A fetal movement count is recommended for every pregnant woman. You may be asked to start counting fetal movements as early as week 28 of your pregnancy. °Pay attention to when your baby is most active. You may notice your baby's sleep and wake cycles. You may also notice things that make your baby move more. You should do a fetal movement count: °· When your baby is normally most active. °· At the same time each day. ° °A good time to count movements is while you are resting, after having something to eat and drink. °How do I count fetal movements? °1. Find a quiet, comfortable area. Sit, or lie down on your side. °2. Write down the date, the start time and stop time, and the number of movements that you felt between those two times. Take this information with you to your health care visits. °3. For 2 hours, count kicks, flutters, swishes, rolls, and jabs. You should feel at least 10 movements during 2 hours. °4. You may stop counting after you have felt 10 movements. °5. If you do not feel 10 movements in 2 hours, have something to eat and drink. Then, keep resting and counting for 1 hour. If you feel at least 4 movements during that hour, you may stop counting. °Contact a health care provider if: °· You feel fewer than 4 movements in 2 hours. °· Your baby is not moving like he or she usually does. °Date: ____________ Start time: ____________ Stop time: ____________ Movements: ____________ °Date: ____________ Start time: ____________ Stop time: ____________ Movements: ____________ °Date: ____________ Start time: ____________ Stop time: ____________ Movements: ____________ °Date: ____________ Start time:  ____________ Stop time: ____________ Movements: ____________ °Date: ____________ Start time: ____________ Stop time: ____________ Movements: ____________ °Date: ____________ Start time: ____________ Stop time: ____________ Movements: ____________ °Date: ____________ Start time: ____________ Stop time: ____________ Movements: ____________ °Date: ____________ Start time: ____________ Stop time: ____________ Movements: ____________ °Date: ____________ Start time: ____________ Stop time: ____________ Movements: ____________ °This information is not intended to replace advice given to you by your health care provider. Make sure you discuss any questions you have with your health care provider. °Document Released: 12/10/2006 Document Revised: 07/09/2016 Document Reviewed: 12/20/2015 °Elsevier Interactive Patient Education © 2018 [employer] ° °Third Trimester of Pregnancy °The third trimester is from week 29 through week 42, months 7 through 9. This trimester is when your unborn baby (fetus) is growing very fast. At the end of the ninth month, the unborn baby is about 20 inches in length. It weighs about 6-10 pounds. °Follow these instructions at home: °· Avoid all smoking, herbs, and alcohol. Avoid drugs not approved by your doctor. °· Do not use any tobacco products, including cigarettes, chewing tobacco, and electronic cigarettes. If you need help quitting, ask your doctor. You may get counseling or other support to help you quit. °· Only take medicine as told by your doctor. Some medicines are safe and some are not during pregnancy. °· Exercise only as told by your doctor. Stop exercising if you start having cramps. °· Eat regular, healthy meals. °· Wear a good support bra if your breasts are tender. °· Do not use hot tubs, steam rooms, or saunas. °· Wear your seat belt   when driving. °· Avoid raw meat, uncooked cheese, and liter boxes and soil used by cats. °· Take your prenatal vitamins. °· Take 1500-2000  milligrams of calcium daily starting at the 20th week of pregnancy until you deliver your baby. °· Try taking medicine that helps you poop (stool softener) as needed, and if your doctor approves. Eat more fiber by eating fresh fruit, vegetables, and whole grains. Drink enough fluids to keep your pee (urine) clear or pale yellow. °· Take warm water baths (sitz baths) to soothe pain or discomfort caused by hemorrhoids. Use hemorrhoid cream if your doctor approves. °· If you have puffy, bulging veins (varicose veins), wear support hose. Raise (elevate) your feet for 15 minutes, 3-4 times a day. Limit salt in your diet. °· Avoid heavy lifting, wear low heels, and sit up straight. °· Rest with your legs raised if you have leg cramps or low back pain. °· Visit your dentist if you have not gone during your pregnancy. Use a soft toothbrush to brush your teeth. Be gentle when you floss. °· You can have sex (intercourse) unless your doctor tells you not to. °· Do not travel far distances unless you must. Only do so with your doctor's approval. °· Take prenatal classes. °· Practice driving to the hospital. °· Pack your hospital bag. °· Prepare the baby's room. °· Go to your doctor visits. °Get help if: °· You are not sure if you are in labor or if your water has broken. °· You are dizzy. °· You have mild cramps or pressure in your lower belly (abdominal). °· You have a nagging pain in your belly area. °· You continue to feel sick to your stomach (nauseous), throw up (vomit), or have watery poop (diarrhea). °· You have bad smelling fluid coming from your vagina. °· You have pain with peeing (urination). °Get help right away if: °· You have a fever. °· You are leaking fluid from your vagina. °· You are spotting or bleeding from your vagina. °· You have severe belly cramping or pain. °· You lose or gain weight rapidly. °· You have trouble catching your breath and have chest pain. °· You notice sudden or extreme puffiness  (swelling) of your face, hands, ankles, feet, or legs. °· You have not felt the baby move in over an hour. °· You have severe headaches that do not go away with medicine. °· You have vision changes. °This information is not intended to replace advice given to you by your health care provider. Make sure you discuss any questions you have with your health care provider. °Document Released: 02/04/2010 Document Revised: 04/17/2016 Document Reviewed: 01/11/2013 °Elsevier Interactive Patient Education © 2017 Elsevier Inc. ° °

## 2018-04-28 NOTE — MAU Note (Signed)
Reports decreased fetal movement.  Last movement was 1 hour ago.  No LOF/VB.  Reports she has a septate uterus.

## 2018-04-28 NOTE — MAU Provider Note (Signed)
Chief Complaint:  Decreased Fetal Movement   First Provider Initiated Contact with Patient 04/28/18 2122     HPI: Katie Brown is a 27 y.o. G1P0 at 27w2dwho presents to maternity admissions reporting decreased fetal movement for past hour. Does feel movement now at hospital.  No other complaints. . She denies LOF, vaginal bleeding, vaginal itching/burning, urinary symptoms, h/a, dizziness, n/v, diarrhea, constipation or fever/chills.    Other  This is a new problem. The current episode started today. The problem has been resolved. Pertinent negatives include no abdominal pain, chest pain, chills, fever, myalgias or nausea. Nothing aggravates the symptoms. She has tried nothing for the symptoms.    RN Note: Reports decreased fetal movement.  Last movement was 1 hour ago.  No LOF/VB.  Reports she has a septate uterus.    Past Medical History: Past Medical History:  Diagnosis Date  . Medical history non-contributory   . Scoliosis     Past obstetric history: OB History  Gravida Para Term Preterm AB Living  1            SAB TAB Ectopic Multiple Live Births               # Outcome Date GA Lbr Len/2nd Weight Sex Delivery Anes PTL Lv  1 Current             Past Surgical History: Past Surgical History:  Procedure Laterality Date  . NO PAST SURGERIES      Family History: Family History  Problem Relation Age of Onset  . Hypertension Mother   . Diabetes Maternal Grandmother   . Diabetes Maternal Grandfather     Social History: Social History   Tobacco Use  . Smoking status: Never Smoker  . Smokeless tobacco: Never Used  Substance Use Topics  . Alcohol use: No  . Drug use: No    Allergies: No Known Allergies  Meds:  Medications Prior to Admission  Medication Sig Dispense Refill Last Dose  . Prenatal Vit-Fe Fumarate-FA (PRENATAL MULTIVITAMIN) TABS tablet Take 1 tablet by mouth daily at 12 noon.   04/28/2018 at Unknown time    I have reviewed patient's Past Medical  Hx, Surgical Hx, Family Hx, Social Hx, medications and allergies.   ROS:  Review of Systems  Constitutional: Negative for chills and fever.  Cardiovascular: Negative for chest pain.  Gastrointestinal: Negative for abdominal pain and nausea.  Musculoskeletal: Negative for myalgias.   Other systems negative  Physical Exam   Patient Vitals for the past 24 hrs:  BP Temp Pulse Resp SpO2 Height Weight  04/28/18 2104 103/74 97.9 F (36.6 C) (!) 101 17 99 % - -  04/28/18 2055 - - - - - 5\' 6"  (1.676 m) 153 lb 2 oz (69.5 kg)   Constitutional: Well-developed, well-nourished female in no acute distress.  Cardiovascular: normal rate and rhythm Respiratory: normal effort, clear to auscultation bilaterally GI: Abd soft, non-tender, gravid appropriate for gestational age.   No rebound or guarding. MS: Extremities nontender, no edema, normal ROM Neurologic: Alert and oriented x 4.  GU: Neg CVAT.  PELVIC EXAM: Deferred  FHT:  Baseline 140 , moderate variability, accelerations present, no decelerations Contractions:  Rare   Labs: Results for orders placed or performed during the hospital encounter of 04/28/18 (from the past 24 hour(s))  Urinalysis, Routine w reflex microscopic     Status: Abnormal   Collection Time: 04/28/18  8:53 PM  Result Value Ref Range   Color, Urine STRAW (A)  YELLOW   APPearance CLEAR CLEAR   Specific Gravity, Urine 1.006 1.005 - 1.030   pH 6.0 5.0 - 8.0   Glucose, UA 50 (A) NEGATIVE mg/dL   Hgb urine dipstick NEGATIVE NEGATIVE   Bilirubin Urine NEGATIVE NEGATIVE   Ketones, ur NEGATIVE NEGATIVE mg/dL   Protein, ur NEGATIVE NEGATIVE mg/dL   Nitrite NEGATIVE NEGATIVE   Leukocytes, UA NEGATIVE NEGATIVE       Imaging:  No results found.  MAU Course/MDM: I have ordered labs and reviewed results. Neg urine  NST reviewed and is reactive, no decels.  Rare contractions Consult Dr Tenny Crawoss with presentation, exam findings and test results.  Treatments in MAU included  EFM/NST.    Assessment: Single IUP at 8629w3d Decreased fetal movement, now resolved Reactive FHR tracing, Category I  Plan: Discharge home Preterm Labor precautions and fetal kick counts Follow up in Office for prenatal visits and recheck of status  Encouraged to return here or to other Urgent Care/ED if she develops worsening of symptoms, increase in pain, fever, or other concerning symptoms.   Pt stable at time of discharge.  Wynelle BourgeoisMarie Chanele Douglas CNM, MSN Certified Nurse-Midwife 04/28/2018 9:22 PM

## 2018-05-21 LAB — OB RESULTS CONSOLE GBS: STREP GROUP B AG: NEGATIVE

## 2018-05-21 LAB — OB RESULTS CONSOLE GC/CHLAMYDIA
Chlamydia: NEGATIVE
Gonorrhea: NEGATIVE

## 2018-06-04 ENCOUNTER — Inpatient Hospital Stay (HOSPITAL_COMMUNITY): Payer: Federal, State, Local not specified - PPO | Admitting: Anesthesiology

## 2018-06-04 ENCOUNTER — Encounter (HOSPITAL_COMMUNITY): Payer: Self-pay

## 2018-06-04 ENCOUNTER — Inpatient Hospital Stay (HOSPITAL_COMMUNITY)
Admission: AD | Admit: 2018-06-04 | Discharge: 2018-06-06 | DRG: 807 | Disposition: A | Discharge status: Home / Self Care | Payer: Federal, State, Local not specified - PPO | Attending: Obstetrics and Gynecology | Admitting: Obstetrics and Gynecology

## 2018-06-04 DIAGNOSIS — O365930 Maternal care for other known or suspected poor fetal growth, third trimester, not applicable or unspecified: Secondary | ICD-10-CM | POA: Diagnosis present

## 2018-06-04 DIAGNOSIS — Z833 Family history of diabetes mellitus: Secondary | ICD-10-CM

## 2018-06-04 DIAGNOSIS — Z3A38 38 weeks gestation of pregnancy: Secondary | ICD-10-CM | POA: Diagnosis not present

## 2018-06-04 DIAGNOSIS — O289 Unspecified abnormal findings on antenatal screening of mother: Secondary | ICD-10-CM | POA: Diagnosis present

## 2018-06-04 DIAGNOSIS — Z8249 Family history of ischemic heart disease and other diseases of the circulatory system: Secondary | ICD-10-CM | POA: Diagnosis not present

## 2018-06-04 LAB — TYPE AND SCREEN
ABO/RH(D): B POS
ANTIBODY SCREEN: NEGATIVE

## 2018-06-04 LAB — CBC
HCT: 33.6 % — ABNORMAL LOW (ref 36.0–46.0)
HEMOGLOBIN: 10.9 g/dL — AB (ref 12.0–15.0)
MCH: 25.6 pg — AB (ref 26.0–34.0)
MCHC: 32.4 g/dL (ref 30.0–36.0)
MCV: 78.9 fL (ref 78.0–100.0)
Platelets: 277 10*3/uL (ref 150–400)
RBC: 4.26 MIL/uL (ref 3.87–5.11)
RDW: 13.7 % (ref 11.5–15.5)
WBC: 8 10*3/uL (ref 4.0–10.5)

## 2018-06-04 LAB — ABO/RH: ABO/RH(D): B POS

## 2018-06-04 MED ORDER — ACETAMINOPHEN 325 MG PO TABS: 650.0000 mg | ORAL_TABLET | ORAL | Status: DC | PRN

## 2018-06-04 MED ORDER — TERBUTALINE SULFATE 1 MG/ML IJ SOLN
0.2500 mg | Freq: Once | INTRAMUSCULAR | Status: DC | PRN
  Filled 2018-06-04: qty 1

## 2018-06-04 MED ORDER — SOD CITRATE-CITRIC ACID 500-334 MG/5ML PO SOLN: 30.0000 mL | ORAL | Status: DC | PRN

## 2018-06-04 MED ORDER — PHENYLEPHRINE 40 MCG/ML (10ML) SYRINGE FOR IV PUSH (FOR BLOOD PRESSURE SUPPORT)
80.0000 ug | PREFILLED_SYRINGE | INTRAVENOUS | Status: DC | PRN
  Filled 2018-06-04: qty 5
  Filled 2018-06-04: qty 10

## 2018-06-04 MED ORDER — EPHEDRINE 5 MG/ML INJ
10.0000 mg | INTRAVENOUS | Status: DC | PRN
  Filled 2018-06-04: qty 2

## 2018-06-04 MED ORDER — OXYTOCIN BOLUS FROM INFUSION
500.0000 mL | Freq: Once | INTRAVENOUS | Status: AC
  Administered 2018-06-05: 500 mL via INTRAVENOUS

## 2018-06-04 MED ORDER — PHENYLEPHRINE 40 MCG/ML (10ML) SYRINGE FOR IV PUSH (FOR BLOOD PRESSURE SUPPORT)
80.0000 ug | PREFILLED_SYRINGE | INTRAVENOUS | Status: DC | PRN
  Filled 2018-06-04: qty 5

## 2018-06-04 MED ORDER — OXYCODONE-ACETAMINOPHEN 5-325 MG PO TABS: 2.0000 | ORAL_TABLET | ORAL | Status: DC | PRN

## 2018-06-04 MED ORDER — OXYTOCIN 40 UNITS IN LACTATED RINGERS INFUSION - SIMPLE MED: 2.5000 [IU]/h | INTRAVENOUS | Status: DC

## 2018-06-04 MED ORDER — LACTATED RINGERS IV SOLN
INTRAVENOUS | Status: DC
  Administered 2018-06-04 – 2018-06-05 (×2): via INTRAVENOUS

## 2018-06-04 MED ORDER — ONDANSETRON HCL 4 MG/2ML IJ SOLN: 4.0000 mg | Freq: Four times a day (QID) | INTRAMUSCULAR | Status: DC | PRN

## 2018-06-04 MED ORDER — DIPHENHYDRAMINE HCL 50 MG/ML IJ SOLN: 12.5000 mg | INTRAMUSCULAR | Status: DC | PRN

## 2018-06-04 MED ORDER — BUTORPHANOL TARTRATE 1 MG/ML IJ SOLN
1.0000 mg | INTRAMUSCULAR | Status: DC | PRN
  Administered 2018-06-04: 1 mg via INTRAVENOUS
  Filled 2018-06-04 (×2): qty 1

## 2018-06-04 MED ORDER — LIDOCAINE HCL (PF) 1 % IJ SOLN
30.0000 mL | INTRAMUSCULAR | Status: DC | PRN
  Filled 2018-06-04: qty 30

## 2018-06-04 MED ORDER — FENTANYL 2.5 MCG/ML BUPIVACAINE 1/10 % EPIDURAL INFUSION (WH - ANES)
14.0000 mL/h | INTRAMUSCULAR | Status: DC | PRN
  Administered 2018-06-04 – 2018-06-05 (×2): 14 mL/h via EPIDURAL
  Filled 2018-06-04 (×2): qty 100

## 2018-06-04 MED ORDER — MISOPROSTOL 25 MCG QUARTER TABLET
25.0000 ug | ORAL_TABLET | ORAL | Status: DC | PRN
  Administered 2018-06-04 – 2018-06-05 (×3): 25 ug via VAGINAL
  Filled 2018-06-04 (×4): qty 1

## 2018-06-04 MED ORDER — LACTATED RINGERS IV SOLN
500.0000 mL | INTRAVENOUS | Status: DC | PRN
  Administered 2018-06-05: 500 mL via INTRAVENOUS

## 2018-06-04 MED ORDER — LACTATED RINGERS IV SOLN: 500.0000 mL | Freq: Once | INTRAVENOUS | Status: DC

## 2018-06-04 MED ORDER — OXYCODONE-ACETAMINOPHEN 5-325 MG PO TABS
1.0000 | ORAL_TABLET | ORAL | Status: DC | PRN
Start: 2018-06-04 — End: 2018-06-05

## 2018-06-04 NOTE — Anesthesia Pain Management Evaluation Note (Signed)
  CRNA Pain Management Visit Note  Patient: Katie Brown, 27 y.o., female  "Hello [facility] am a [facility]. We have an anesthesia team available at all times to provide care throughout the hospital, including epidural management and anesthesia for C-section. I don't know your plan for the delivery whether it a natural birth, water birth, IV sedation, nitrous supplementation, doula or epidural, but we want to meet your pain goals."   1.Was your pain managed to your expectations on prior hospitalizations?   No prior hospitalizations  2.What is your expectation for pain management during this hospitalization?     Epidural  3.How can we help you reach that goal? **support*  Record the patient's initial score and the patient's pain goal.   Pain: 4  Pain Goal: 7 [facility] wants you to be able to say your pain was always managed very well.  Katie Brown,Katie Brown 06/04/2018

## 2018-06-04 NOTE — H&P (Signed)
Katie BoehringerKateary Brown is a 27 y.o. female presenting for induction  27 yo G1 P0 @ 38+0 presents for IOL for abnormal BPP of 6/8 at term. The patient's pregnancy has been complicated by IUGR. EFW today <10 % with AC <2.3% Given abnormal antenatal testing in context of IUGR the decision was made to proceed with IOL OB History    Gravida  1   Para      Term      Preterm      AB      Living        SAB      TAB      Ectopic      Multiple      Live Births             Past Medical History:  Diagnosis Date  . Medical history non-contributory   . Scoliosis    Past Surgical History:  Procedure Laterality Date  . NO PAST SURGERIES     Family History: family history includes Diabetes in her maternal grandfather and maternal grandmother; Hypertension in her mother. Social History:  reports that she has never smoked. She has never used smokeless tobacco. She reports that she does not drink alcohol or use drugs.     Maternal Diabetes: No Genetic Screening: Normal Maternal Ultrasounds/Referrals: Abnormal:  Findings:   IUGR Fetal Ultrasounds or other Referrals:  None Maternal Substance Abuse:  No Significant Maternal Medications:  None Significant Maternal Lab Results:  None Other Comments:  None  ROS History   Height 5\' 6"  (1.676 m), weight 74 kg (163 lb 1.6 oz), last menstrual period 09/16/2017. Exam Physical Exam  Prenatal labs: ABO, Rh:   Antibody:   Rubella: Immune (01/14 0000) RPR: Nonreactive (04/25 0000)  HBsAg: Negative (01/14 0000)  HIV: Non-reactive (01/14 0000)  GBS: Negative (06/28 0000)   Assessment/Plan: 1) Admit 2) Misoprostal 25mcg PV Q 4 3) Epidural on request  Katie Brown 06/04/2018, 5:01 PM

## 2018-06-05 ENCOUNTER — Encounter (HOSPITAL_COMMUNITY): Payer: Self-pay | Admitting: *Deleted

## 2018-06-05 LAB — RPR: RPR Ser Ql: NONREACTIVE

## 2018-06-05 MED ORDER — COCONUT OIL OIL: 1.0000 "application " | TOPICAL_OIL | Status: DC | PRN

## 2018-06-05 MED ORDER — ONDANSETRON HCL 4 MG/2ML IJ SOLN: 4.0000 mg | INTRAMUSCULAR | Status: DC | PRN

## 2018-06-05 MED ORDER — LACTATED RINGERS IV SOLN
500.0000 mL | Freq: Once | INTRAVENOUS | Status: AC
  Administered 2018-06-04: 500 mL via INTRAVENOUS

## 2018-06-05 MED ORDER — ACETAMINOPHEN 325 MG PO TABS: 650.0000 mg | ORAL_TABLET | ORAL | Status: DC | PRN

## 2018-06-05 MED ORDER — ZOLPIDEM TARTRATE 5 MG PO TABS: 5.0000 mg | ORAL_TABLET | Freq: Every evening | ORAL | Status: DC | PRN

## 2018-06-05 MED ORDER — SIMETHICONE 80 MG PO CHEW: 80.0000 mg | CHEWABLE_TABLET | ORAL | Status: DC | PRN

## 2018-06-05 MED ORDER — ONDANSETRON HCL 4 MG PO TABS: 4.0000 mg | ORAL_TABLET | ORAL | Status: DC | PRN

## 2018-06-05 MED ORDER — LIDOCAINE HCL (PF) 1 % IJ SOLN
INTRAMUSCULAR | Status: DC | PRN
  Administered 2018-06-04: 5 mL via EPIDURAL
  Administered 2018-06-04: 2 mL via EPIDURAL
  Administered 2018-06-04: 3 mL via EPIDURAL

## 2018-06-05 MED ORDER — WITCH HAZEL-GLYCERIN EX PADS: 1.0000 "application " | MEDICATED_PAD | CUTANEOUS | Status: DC | PRN

## 2018-06-05 MED ORDER — SENNOSIDES-DOCUSATE SODIUM 8.6-50 MG PO TABS
2.0000 | ORAL_TABLET | ORAL | Status: DC
  Administered 2018-06-05: 2 via ORAL
  Filled 2018-06-05: qty 2

## 2018-06-05 MED ORDER — DIPHENHYDRAMINE HCL 25 MG PO CAPS: 25.0000 mg | ORAL_CAPSULE | Freq: Four times a day (QID) | ORAL | Status: DC | PRN

## 2018-06-05 MED ORDER — BENZOCAINE-MENTHOL 20-0.5 % EX AERO
1.0000 "application " | INHALATION_SPRAY | CUTANEOUS | Status: DC | PRN
  Administered 2018-06-05: 1 via TOPICAL
  Filled 2018-06-05: qty 56

## 2018-06-05 MED ORDER — IBUPROFEN 600 MG PO TABS
600.0000 mg | ORAL_TABLET | Freq: Four times a day (QID) | ORAL | Status: DC
  Administered 2018-06-05 – 2018-06-06 (×5): 600 mg via ORAL
  Filled 2018-06-05 (×5): qty 1

## 2018-06-05 MED ORDER — PRENATAL MULTIVITAMIN CH
1.0000 | ORAL_TABLET | Freq: Every day | ORAL | Status: DC
  Administered 2018-06-05 – 2018-06-06 (×2): 1 via ORAL
  Filled 2018-06-05 (×2): qty 1

## 2018-06-05 MED ORDER — TETANUS-DIPHTH-ACELL PERTUSSIS 5-2.5-18.5 LF-MCG/0.5 IM SUSP: 0.5000 mL | Freq: Once | INTRAMUSCULAR | Status: DC

## 2018-06-05 MED ORDER — DIBUCAINE 1 % RE OINT: 1.0000 "application " | TOPICAL_OINTMENT | RECTAL | Status: DC | PRN

## 2018-06-05 NOTE — Anesthesia Procedure Notes (Signed)
Epidural Patient location during procedure: OB Start time: 06/04/2018 11:47 PM End time: 06/04/2018 11:53 PM  Staffing Anesthesiologist: Cecile Hearingurk, Stephen Edward, MD Performed: anesthesiologist   Preanesthetic Checklist Completed: patient identified, pre-op evaluation, timeout performed, IV checked, risks and benefits discussed and monitors and equipment checked  Epidural Patient position: sitting Prep: DuraPrep Patient monitoring: blood pressure and continuous pulse ox Approach: midline Location: L3-L4 Injection technique: LOR air  Needle:  Needle type: Tuohy  Needle gauge: 17 G Needle length: 9 cm Needle insertion depth: 4 cm Catheter size: 19 Gauge Catheter at skin depth: 9 cm Test dose: negative and Other (1% Lidocaine)  Additional Notes Patient identified.  Risk benefits discussed including failed block, incomplete pain control, headache, nerve damage, paralysis, blood pressure changes, nausea, vomiting, reactions to medication both toxic or allergic, and postpartum back pain.  Patient expressed understanding and wished to proceed.  All questions were answered.  Sterile technique used throughout procedure and epidural site dressed with sterile barrier dressing. No paresthesia or other complications noted. The patient did not experience any signs of intravascular injection such as tinnitus or metallic taste in mouth nor signs of intrathecal spread such as rapid motor block. Please see nursing notes for vital signs. Reason for block:procedure for pain

## 2018-06-05 NOTE — Progress Notes (Signed)
Start pushing and call me when you need me.

## 2018-06-05 NOTE — Anesthesia Preprocedure Evaluation (Signed)
Anesthesia Evaluation  Patient identified by MRN, date of birth, ID band Patient awake    Reviewed: Allergy & Precautions, NPO status , Patient's Chart, lab work & pertinent test results  Airway Mallampati: II  TM Distance: >3 FB Neck ROM: Full    Dental  (+) Teeth Intact, Dental Advisory Given, Missing,    Pulmonary neg pulmonary ROS,    Pulmonary exam normal breath sounds clear to auscultation       Cardiovascular negative cardio ROS Normal cardiovascular exam Rhythm:Regular Rate:Normal     Neuro/Psych negative neurological ROS  negative psych ROS   GI/Hepatic negative GI ROS, Neg liver ROS,   Endo/Other  negative endocrine ROS  Renal/GU negative Renal ROS     Musculoskeletal negative musculoskeletal ROS (+)   Abdominal   Peds  Hematology  (+) Blood dyscrasia, anemia , Plt 277k   Anesthesia Other Findings Day of surgery medications reviewed with the patient.  Reproductive/Obstetrics (+) Pregnancy                             Anesthesia Physical Anesthesia Plan  ASA: II  Anesthesia Plan: Epidural   Post-op Pain Management:    Induction:   PONV Risk Score and Plan: 2 and Treatment may vary due to age or medical condition  Airway Management Planned: Natural Airway  Additional Equipment:   Intra-op Plan:   Post-operative Plan:   Informed Consent: I have reviewed the patients History and Physical, chart, labs and discussed the procedure including the risks, benefits and alternatives for the proposed anesthesia with the patient or authorized representative who has indicated his/her understanding and acceptance.   Dental advisory given  Plan Discussed with:   Anesthesia Plan Comments: (Patient identified. Risks/Benefits/Options discussed with patient including but not limited to bleeding, infection, nerve damage, paralysis, failed block, incomplete pain control, headache, blood  pressure changes, nausea, vomiting, reactions to medication both or allergic, itching and postpartum back pain. Confirmed with bedside nurse the patient's most recent platelet count. Confirmed with patient that they are not currently taking any anticoagulation, have any bleeding history or any family history of bleeding disorders. Patient expressed understanding and wished to proceed. All questions were answered. )        Anesthesia Quick Evaluation

## 2018-06-05 NOTE — Lactation Note (Signed)
This note was copied from a baby's chart. Lactation Consultation Note  Patient Name: Girl Tonna BoehringerKateary Blackie RUEAV'WToday's Date: 06/05/2018 Reason for consult: Initial assessment;1st time breastfeeding;Primapara;Early term 6837-38.6wks  10 hours old early term female who is being exclusively BF by her mother, she's a P1. [facility], she already knows how to hand express; [facility] with [facility] expression, colostrum was pouring of her breast, her tissue is very compressible and her nipples were intact with no signs of trauma. She has a DEBP at home  Offered assistance with latch and mom agreed to wake baby up to feed, noticed that baby had a dirty diaper, documented in Flowsheets. LC took baby STS to mom's breast in football position but she didn't latch, she kept falling asleep. LC did some suck training and baby will suck on a gloved finger with a nice rhythmical patter but not at mother's breast. Baby won't open her mouth wide enough to achieve a deep latch. Documented attempt and asked mom to call for latch assistance the next time baby is ready to feed.   Encouraged mom to feed baby 8-12 times in 24 hours or sooner if feeding cues are present. If baby is not cueing in a 3 hour period, she'll wake her up to feed. Reviewed BF brochure, BF resources and feeding diary, mom is aware of LC services and will call PRN.  Maternal Data Formula Feeding for Exclusion: No Has patient been taught Hand Expression?: Yes Does the patient have breastfeeding experience prior to this delivery?: No  Feeding Feeding Type: Breast Fed Length of feed: 15 min  LATCH Score Latch: Repeated attempts needed to sustain latch, nipple held in mouth throughout feeding, stimulation needed to elicit sucking reflex.  Audible Swallowing: A few with stimulation  Type of Nipple: Everted at rest and after stimulation  Comfort (Breast/Nipple): Soft / non-tender  Hold (Positioning): No  assistance needed to correctly position infant at breast.  LATCH Score: 8  Interventions Interventions: Breast feeding basics reviewed;Assisted with latch;Support pillows;Skin to skin;Breast massage;Hand express;Breast compression;Adjust position  Lactation Tools Discussed/Used WIC Program: Yes   Consult Status Consult Status: Follow-up Date: 06/06/18 Follow-up type: In-patient    Adine Heimann Venetia ConstableS Carri Spillers 06/05/2018, 6:08 PM
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
## 2018-06-05 NOTE — Anesthesia Postprocedure Evaluation (Signed)
Anesthesia Post Note  Patient: Katie Brown  Procedure(s) Performed: AN AD HOC LABOR EPIDURAL     Patient location during evaluation: Mother Baby Anesthesia Type: Epidural Level of consciousness: awake and alert Pain management: pain level controlled Vital Signs Assessment: post-procedure vital signs reviewed and stable Respiratory status: spontaneous breathing, nonlabored ventilation and respiratory function stable Cardiovascular status: stable Postop Assessment: no headache, no backache, epidural receding and no apparent nausea or vomiting Anesthetic complications: no    Last Vitals:  Vitals:   06/05/18 1111 06/05/18 1515  BP: 121/87 113/66  Pulse: 95 78  Resp: 18 18  Temp: 36.8 C 37.2 C  SpO2: 100% 100%    Last Pain:  Vitals:   06/05/18 1515  TempSrc: Oral  PainSc: 0-No pain   Pain Goal:                 Rica RecordsICKELTON,Treshon Stannard

## 2018-06-06 LAB — CBC
HEMATOCRIT: 30 % — AB (ref 36.0–46.0)
Hemoglobin: 9.8 g/dL — ABNORMAL LOW (ref 12.0–15.0)
MCH: 25.5 pg — ABNORMAL LOW (ref 26.0–34.0)
MCHC: 32.7 g/dL (ref 30.0–36.0)
MCV: 78.1 fL (ref 78.0–100.0)
PLATELETS: 220 10*3/uL (ref 150–400)
RBC: 3.84 MIL/uL — ABNORMAL LOW (ref 3.87–5.11)
RDW: 13.8 % (ref 11.5–15.5)
WBC: 10.1 10*3/uL (ref 4.0–10.5)

## 2018-06-06 MED ORDER — DOCUSATE SODIUM 100 MG PO CAPS: 100.0000 mg | ORAL_CAPSULE | Freq: Two times a day (BID) | ORAL | 0 refills | Status: DC

## 2018-06-06 MED ORDER — OXYCODONE-ACETAMINOPHEN 5-325 MG PO TABS: 1.0000 | ORAL_TABLET | Freq: Four times a day (QID) | ORAL | 0 refills | Status: DC | PRN

## 2018-06-06 MED ORDER — IBUPROFEN 600 MG PO TABS: 600.0000 mg | ORAL_TABLET | Freq: Four times a day (QID) | ORAL | 0 refills | Status: DC | PRN

## 2018-06-06 NOTE — Lactation Note (Signed)
This note was copied from a baby's chart. Lactation Consultation Note  Patient Name: Katie Brown OZHYQ'MToday's Date: 06/06/2018  Mom currently has baby latched to breast.  Baby is actively sucking with good swallows.  Mom is feeling good about feedings.  Lactation outpatient services and support reviewed and encouraged prn.   Maternal Data    Feeding    LATCH Score                   Interventions    Lactation Tools Discussed/Used     Consult Status      Huston FoleyMOULDEN, Dearra Myhand S 06/06/2018, 2:49 PM
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
## 2018-06-06 NOTE — Discharge Summary (Signed)
Obstetric Discharge Summary Reason for Admission: induction of labor Prenatal Procedures: NST and ultrasound Intrapartum Procedures: spontaneous vaginal delivery Postpartum Procedures: none Complications-Operative and Postpartum: 2nd degree perineal laceration Hemoglobin  Date Value Ref Range Status  06/06/2018 9.8 (L) 12.0 - 15.0 g/dL Final   HCT  Date Value Ref Range Status  06/06/2018 30.0 (L) 36.0 - 46.0 % Final    Physical Exam:  General: alert, cooperative and appears stated age 87Lochia: appropriate Uterine Fundus: firm Incision: healing well DVT Evaluation: No evidence of DVT seen on physical exam.  Discharge Diagnoses: Term Pregnancy-delivered  Discharge Information: Date: 06/06/2018 Activity: pelvic rest Diet: routine Medications: Ibuprofen, Colace and Percocet Condition: improved Instructions: refer to practice specific booklet Discharge to: home Follow-up Information    Katie Brown, Katie Placzek, MD Follow up in 4 week(s).   Specialty:  Obstetrics and Gynecology Why:  for a postpartum eval Contact information: [address] [place] [id number] [phone]           Newborn Data: Live born female  Birth Weight: 6 lb 5 oz (2863 g) APGAR: 9, 9  Newborn Delivery   Birth date/time:  06/05/2018 08:02:00 Delivery type:  Vaginal, Spontaneous     Home with mother.  Katie ReedsKendra Massiah Brown 06/06/2018, 11:29 AM

## 2018-06-15 ENCOUNTER — Encounter (HOSPITAL_COMMUNITY): Payer: Self-pay | Admitting: *Deleted

## 2018-06-15 ENCOUNTER — Other Ambulatory Visit: Payer: Self-pay

## 2018-06-15 ENCOUNTER — Inpatient Hospital Stay (HOSPITAL_COMMUNITY)
Admission: AD | Admit: 2018-06-15 | Discharge: 2018-06-17 | DRG: 776 | Disposition: A | Discharge status: Home / Self Care | Payer: Federal, State, Local not specified - PPO | Attending: Obstetrics and Gynecology | Admitting: Obstetrics and Gynecology

## 2018-06-15 DIAGNOSIS — O1415 Severe pre-eclampsia, complicating the puerperium: Principal | ICD-10-CM | POA: Diagnosis present

## 2018-06-15 DIAGNOSIS — O864 Pyrexia of unknown origin following delivery: Secondary | ICD-10-CM | POA: Diagnosis not present

## 2018-06-15 DIAGNOSIS — O9122 Nonpurulent mastitis associated with the puerperium: Secondary | ICD-10-CM | POA: Diagnosis present

## 2018-06-15 DIAGNOSIS — O1495 Unspecified pre-eclampsia, complicating the puerperium: Secondary | ICD-10-CM | POA: Diagnosis not present

## 2018-06-15 DIAGNOSIS — O9279 Other disorders of lactation: Secondary | ICD-10-CM

## 2018-06-15 DIAGNOSIS — R030 Elevated blood-pressure reading, without diagnosis of hypertension: Secondary | ICD-10-CM | POA: Diagnosis not present

## 2018-06-15 LAB — PROTEIN / CREATININE RATIO, URINE
Creatinine, Urine: 272 mg/dL
Protein Creatinine Ratio: 0.38 mg/mg{Cre} — ABNORMAL HIGH (ref 0.00–0.15)
Total Protein, Urine: 104 mg/dL

## 2018-06-15 LAB — COMPREHENSIVE METABOLIC PANEL
ALBUMIN: 3.1 g/dL — AB (ref 3.5–5.0)
ALT: 16 U/L (ref 0–44)
AST: 18 U/L (ref 15–41)
Alkaline Phosphatase: 141 U/L — ABNORMAL HIGH (ref 38–126)
Anion gap: 9 (ref 5–15)
BUN: 10 mg/dL (ref 6–20)
CHLORIDE: 104 mmol/L (ref 98–111)
CO2: 23 mmol/L (ref 22–32)
Calcium: 9 mg/dL (ref 8.9–10.3)
Creatinine, Ser: 0.71 mg/dL (ref 0.44–1.00)
GFR calc Af Amer: >60 mL/min (ref >60)
GFR calc non Af Amer: >60 mL/min (ref >60)
GLUCOSE: 99 mg/dL (ref 70–99)
Potassium: 3.8 mmol/L (ref 3.5–5.1)
Sodium: 136 mmol/L (ref 135–145)
Total Bilirubin: 0.9 mg/dL (ref 0.3–1.2)
Total Protein: 6.3 g/dL — ABNORMAL LOW (ref 6.5–8.1)

## 2018-06-15 LAB — CBC WITH DIFFERENTIAL/PLATELET
Basophils Absolute: 0 10*3/uL (ref 0.0–0.1)
Basophils Relative: 0 %
EOS ABS: 0.3 10*3/uL (ref 0.0–0.7)
Eosinophils Relative: 3 %
HEMATOCRIT: 34.1 % — AB (ref 36.0–46.0)
Hemoglobin: 10.9 g/dL — ABNORMAL LOW (ref 12.0–15.0)
LYMPHS ABS: 1.4 10*3/uL (ref 0.7–4.0)
LYMPHS PCT: 14 %
MCH: 25.2 pg — ABNORMAL LOW (ref 26.0–34.0)
MCHC: 32 g/dL (ref 30.0–36.0)
MCV: 78.9 fL (ref 78.0–100.0)
MONOS PCT: 4 %
Monocytes Absolute: 0.4 10*3/uL (ref 0.1–1.0)
NEUTROS PCT: 79 %
Neutro Abs: 8.2 10*3/uL — ABNORMAL HIGH (ref 1.7–7.7)
PLATELETS: 268 10*3/uL (ref 150–400)
RBC: 4.32 MIL/uL (ref 3.87–5.11)
RDW: 14.8 % (ref 11.5–15.5)
WBC: 10.3 10*3/uL (ref 4.0–10.5)

## 2018-06-15 LAB — URINALYSIS, ROUTINE W REFLEX MICROSCOPIC
Bacteria, UA: NONE SEEN
Bilirubin Urine: NEGATIVE
GLUCOSE, UA: NEGATIVE mg/dL
KETONES UR: 80 mg/dL — AB
NITRITE: NEGATIVE
PROTEIN: 30 mg/dL — AB
Specific Gravity, Urine: 1.033 — ABNORMAL HIGH (ref 1.005–1.030)
pH: 5 (ref 5.0–8.0)

## 2018-06-15 MED ORDER — MAGNESIUM SULFATE BOLUS VIA INFUSION
6.0000 g | Freq: Once | INTRAVENOUS | Status: AC
  Administered 2018-06-15: 6 g via INTRAVENOUS
  Filled 2018-06-15: qty 500

## 2018-06-15 MED ORDER — SIMETHICONE 80 MG PO CHEW: 80.0000 mg | CHEWABLE_TABLET | ORAL | Status: DC | PRN

## 2018-06-15 MED ORDER — OXYCODONE-ACETAMINOPHEN 5-325 MG PO TABS: 1.0000 | ORAL_TABLET | ORAL | Status: DC | PRN

## 2018-06-15 MED ORDER — COCONUT OIL OIL: 1.0000 "application " | TOPICAL_OIL | Status: DC | PRN

## 2018-06-15 MED ORDER — IBUPROFEN 600 MG PO TABS
600.0000 mg | ORAL_TABLET | Freq: Once | ORAL | Status: AC
  Administered 2018-06-15: 600 mg via ORAL
  Filled 2018-06-15: qty 1

## 2018-06-15 MED ORDER — WITCH HAZEL-GLYCERIN EX PADS: 1.0000 "application " | MEDICATED_PAD | CUTANEOUS | Status: DC | PRN

## 2018-06-15 MED ORDER — CEPHALEXIN 500 MG PO CAPS
500.0000 mg | ORAL_CAPSULE | Freq: Four times a day (QID) | ORAL | Status: DC
  Administered 2018-06-15 – 2018-06-17 (×8): 500 mg via ORAL
  Filled 2018-06-15 (×8): qty 1

## 2018-06-15 MED ORDER — LACTATED RINGERS IV SOLN
INTRAVENOUS | Status: DC
  Administered 2018-06-15 – 2018-06-16 (×3): via INTRAVENOUS

## 2018-06-15 MED ORDER — DIPHENHYDRAMINE HCL 25 MG PO CAPS: 25.0000 mg | ORAL_CAPSULE | Freq: Four times a day (QID) | ORAL | Status: DC | PRN

## 2018-06-15 MED ORDER — TETANUS-DIPHTH-ACELL PERTUSSIS 5-2.5-18.5 LF-MCG/0.5 IM SUSP: 0.5000 mL | Freq: Once | INTRAMUSCULAR | Status: DC

## 2018-06-15 MED ORDER — OXYCODONE-ACETAMINOPHEN 5-325 MG PO TABS: 2.0000 | ORAL_TABLET | ORAL | Status: DC | PRN

## 2018-06-15 MED ORDER — ACETAMINOPHEN 325 MG PO TABS
650.0000 mg | ORAL_TABLET | ORAL | Status: DC | PRN
  Administered 2018-06-15: 650 mg via ORAL
  Filled 2018-06-15: qty 2

## 2018-06-15 MED ORDER — ONDANSETRON HCL 4 MG/2ML IJ SOLN: 4.0000 mg | INTRAMUSCULAR | Status: DC | PRN

## 2018-06-15 MED ORDER — IBUPROFEN 600 MG PO TABS
600.0000 mg | ORAL_TABLET | Freq: Four times a day (QID) | ORAL | Status: DC
  Administered 2018-06-16 – 2018-06-17 (×7): 600 mg via ORAL
  Filled 2018-06-15 (×7): qty 1

## 2018-06-15 MED ORDER — MAGNESIUM SULFATE 40 G IN LACTATED RINGERS - SIMPLE
2.0000 g/h | INTRAVENOUS | Status: AC
  Administered 2018-06-15 – 2018-06-16 (×2): 2 g/h via INTRAVENOUS
  Filled 2018-06-15 (×2): qty 40

## 2018-06-15 MED ORDER — PRENATAL MULTIVITAMIN CH
1.0000 | ORAL_TABLET | Freq: Every day | ORAL | Status: DC
  Administered 2018-06-16 – 2018-06-17 (×2): 1 via ORAL
  Filled 2018-06-15 (×2): qty 1

## 2018-06-15 MED ORDER — HYDRALAZINE HCL 20 MG/ML IJ SOLN: 10.0000 mg | Freq: Once | INTRAMUSCULAR | Status: DC | PRN

## 2018-06-15 MED ORDER — LABETALOL HCL 5 MG/ML IV SOLN
20.0000 mg | INTRAVENOUS | Status: DC | PRN
  Filled 2018-06-15: qty 4

## 2018-06-15 MED ORDER — SENNOSIDES-DOCUSATE SODIUM 8.6-50 MG PO TABS
2.0000 | ORAL_TABLET | ORAL | Status: DC
  Administered 2018-06-16 (×2): 2 via ORAL
  Filled 2018-06-15 (×2): qty 2

## 2018-06-15 MED ORDER — BENZOCAINE-MENTHOL 20-0.5 % EX AERO: 1.0000 "application " | INHALATION_SPRAY | CUTANEOUS | Status: DC | PRN

## 2018-06-15 MED ORDER — DIBUCAINE 1 % RE OINT: 1.0000 "application " | TOPICAL_OINTMENT | RECTAL | Status: DC | PRN

## 2018-06-15 MED ORDER — OXYCODONE-ACETAMINOPHEN 5-325 MG PO TABS
1.0000 | ORAL_TABLET | Freq: Once | ORAL | Status: AC
  Administered 2018-06-15: 1 via ORAL
  Filled 2018-06-15: qty 1

## 2018-06-15 MED ORDER — ONDANSETRON HCL 4 MG PO TABS: 4.0000 mg | ORAL_TABLET | ORAL | Status: DC | PRN

## 2018-06-15 MED ORDER — ZOLPIDEM TARTRATE 5 MG PO TABS: 5.0000 mg | ORAL_TABLET | Freq: Every evening | ORAL | Status: DC | PRN

## 2018-06-15 NOTE — H&P (Signed)
Katie Brown is a 27 y.o. female presenting for elevated blood pressures  27 yo G1P1 s/p SVD on 7/13 presents to MAU for evaluation of sharp stabbing headaches that started yesterday. She was noted to have elevated blood pressures on admission. Given combination of elevated BPs with headaches the decision was made to admit for PP pre-eclampsia. Additionally, on admission she was found to have a low grade temp of 100.7. She was found to have breast engorgement and some right breast streaking on exam. The decision was made to have the patient pump and see if the fever resolved with resolution of engorgement. However, upon arrival to the floor the patient's temp was 103. Therefore, oral antibiotics were started for mastitis OB History    Gravida  1   Para  1   Term  1   Preterm      AB      Living  1     SAB      TAB      Ectopic      Multiple  0   Live Births  1          Past Medical History:  Diagnosis Date  . Medical history non-contributory   . Scoliosis    Past Surgical History:  Procedure Laterality Date  . NO PAST SURGERIES     Family History: family history includes Diabetes in her maternal grandfather and maternal grandmother; Hypertension in her mother. Social History:  reports that she has never smoked. She has never used smokeless tobacco. She reports that she does not drink alcohol or use drugs.  ROS History   Blood pressure 125/89, pulse (!) 105, temperature 98.9 F (37.2 C), temperature source Oral, resp. rate 18, height 5\' 6"  (1.676 m), weight 67.7 kg (149 lb 4 oz), SpO2 99 %, currently breastfeeding. Exam Physical Exam  Prenatal labs: ABO, Rh: --/--/B POS, B POS Performed at [facility], [address], [place], [place] [address]  [phone]) Antibody: NEG (07/12 1712) Rubella: Immune (01/14 0000) RPR: Non Reactive (07/12 1712)  HBsAg: Negative (01/14 0000)  HIV: Non-reactive (01/14 0000)  GBS: Negative (06/28 0000)   . Results for  orders placed or performed during the hospital encounter of 06/15/18 (from the past 24 hour(s))  CBC with Differential/Platelet     Status: Abnormal   Collection Time: 06/15/18  1:14 PM  Result Value Ref Range   WBC 10.3 4.0 - 10.5 K/uL   RBC 4.32 3.87 - 5.11 MIL/uL   Hemoglobin 10.9 (L) 12.0 - 15.0 g/dL   HCT 40.9 (L) 81.1 - 91.4 %   MCV 78.9 78.0 - 100.0 fL   MCH 25.2 (L) 26.0 - 34.0 pg   MCHC 32.0 30.0 - 36.0 g/dL   RDW 78.2 95.6 - 21.3 %   Platelets 268 150 - 400 K/uL   Neutrophils Relative % 79 %   Neutro Abs 8.2 (H) 1.7 - 7.7 K/uL   Lymphocytes Relative 14 %   Lymphs Abs 1.4 0.7 - 4.0 K/uL   Monocytes Relative 4 %   Monocytes Absolute 0.4 0.1 - 1.0 K/uL   Eosinophils Relative 3 %   Eosinophils Absolute 0.3 0.0 - 0.7 K/uL   Basophils Relative 0 %   Basophils Absolute 0.0 0.0 - 0.1 K/uL  Comprehensive metabolic panel     Status: Abnormal   Collection Time: 06/15/18  1:14 PM  Result Value Ref Range   Sodium 136 135 - 145 mmol/L   Potassium 3.8 3.5 -  5.1 mmol/L   Chloride 104 98 - 111 mmol/L   CO2 23 22 - 32 mmol/L   Glucose, Bld 99 70 - 99 mg/dL   BUN 10 6 - 20 mg/dL   Creatinine, Ser 4.090.71 0.44 - 1.00 mg/dL   Calcium 9.0 8.9 - 81.110.3 mg/dL   Total Protein 6.3 (L) 6.5 - 8.1 g/dL   Albumin 3.1 (L) 3.5 - 5.0 g/dL   AST 18 15 - 41 U/L   ALT 16 0 - 44 U/L   Alkaline Phosphatase 141 (H) 38 - 126 U/L   Total Bilirubin 0.9 0.3 - 1.2 mg/dL   GFR calc non Af Amer >60 >60 mL/min   GFR calc Af Amer >60 >60 mL/min   Anion gap 9 5 - 15  Protein / creatinine ratio, urine     Status: Abnormal   Collection Time: 06/15/18  1:50 PM  Result Value Ref Range   Creatinine, Urine 272.00 mg/dL   Total Protein, Urine 104 mg/dL   Protein Creatinine Ratio 0.38 (H) 0.00 - 0.15 mg/mg[Cre]  Urinalysis, Routine w reflex microscopic     Status: Abnormal   Collection Time: 06/15/18  1:50 PM  Result Value Ref Range   Color, Urine YELLOW YELLOW   APPearance HAZY (A) CLEAR   Specific Gravity, Urine  1.033 (H) 1.005 - 1.030   pH 5.0 5.0 - 8.0   Glucose, UA NEGATIVE NEGATIVE mg/dL   Hgb urine dipstick LARGE (A) NEGATIVE   Bilirubin Urine NEGATIVE NEGATIVE   Ketones, ur 80 (A) NEGATIVE mg/dL   Protein, ur 30 (A) NEGATIVE mg/dL   Nitrite NEGATIVE NEGATIVE   Leukocytes, UA MODERATE (A) NEGATIVE   RBC / HPF 21-50 0 - 5 RBC/hpf   WBC, UA 21-50 0 - 5 WBC/hpf   Bacteria, UA NONE SEEN NONE SEEN   Squamous Epithelial / LPF 0-5 0 - 5   Mucus PRESENT      Assessment/Plan: 1) Admit 2) Mag sulfate for seizure prevention  3) Labetalol for hypertension control 4) oral keflex for mastitis, if spikes after starting oral abx will change to IV  Waynard ReedsKendra Kerrigan [place] 06/15/2018, 8:47 PM

## 2018-06-15 NOTE — MAU Provider Note (Signed)
Chief Complaint  Patient presents with  . Headache     First Provider Initiated Contact with Patient 06/15/18 1350      S: Katie Brown  is a 27 y.o. y.o. year old G70P1001 female at 80 days S/P SVD on 06/05/18 who presents to MAU with severe HA since yesterday and incidentally found to have elevated blood pressures  And fever in MAU today. No Hx HTN. Current blood pressure medication: None. No Hx significant HA's  Scant lochia.   Headache: Intermittent, 10/10, back of head and behind ears bilat, sharp stabbing. No improvement w/ IBU. Significant improvement w/ Percocet.   Associated symptoms: Pos for Headache and subjective fever. Neg for chills, vision changes, epigastric pain, low abd pain, breast pain or masses, respiratory  complaints, GI complaints or other possible infections.   O:  Patient Vitals for the past 24 hrs:  BP Temp Temp src Pulse Resp SpO2 Weight  06/15/18 1400 (!) 149/97 - - (!) 110 - - -  06/15/18 1331 (!) 137/96 - - (!) 112 - - -  06/15/18 1319 (!) 137/102 - - (!) 120 - - -  06/15/18 1257 (!) 139/92 (!) 100.7 F (38.2 C) Oral (!) 119 20 100 % 149 lb 4 oz (67.7 kg)   General: Mild distress Heart: Regular rate Lungs: Normal rate and effort Breasts: Left breast very firm, diffuse erythema on inner and low breast. No discreet mass, tenderness or fluctuance. Right breast full, but otherwise Nml. Abd: Soft, NT. Fundus non-palpable Extremities: Tr Pedal edema Neuro: 2+ deep tendon reflexes, No clonus    Results for orders placed or performed during the hospital encounter of 06/15/18 (from the past 24 hour(s))  CBC with Differential/Platelet     Status: Abnormal   Collection Time: 06/15/18  1:14 PM  Result Value Ref Range   WBC 10.3 4.0 - 10.5 K/uL   RBC 4.32 3.87 - 5.11 MIL/uL   Hemoglobin 10.9 (L) 12.0 - 15.0 g/dL   HCT 16.1 (L) 09.6 - 04.5 %   MCV 78.9 78.0 - 100.0 fL   MCH 25.2 (L) 26.0 - 34.0 pg   MCHC 32.0 30.0 - 36.0 g/dL   RDW 40.9 81.1 - 91.4 %    Platelets 268 150 - 400 K/uL   Neutrophils Relative % 79 %   Neutro Abs 8.2 (H) 1.7 - 7.7 K/uL   Lymphocytes Relative 14 %   Lymphs Abs 1.4 0.7 - 4.0 K/uL   Monocytes Relative 4 %   Monocytes Absolute 0.4 0.1 - 1.0 K/uL   Eosinophils Relative 3 %   Eosinophils Absolute 0.3 0.0 - 0.7 K/uL   Basophils Relative 0 %   Basophils Absolute 0.0 0.0 - 0.1 K/uL  Comprehensive metabolic panel     Status: Abnormal   Collection Time: 06/15/18  1:14 PM  Result Value Ref Range   Sodium 136 135 - 145 mmol/L   Potassium 3.8 3.5 - 5.1 mmol/L   Chloride 104 98 - 111 mmol/L   CO2 23 22 - 32 mmol/L   Glucose, Bld 99 70 - 99 mg/dL   BUN 10 6 - 20 mg/dL   Creatinine, Ser 7.82 0.44 - 1.00 mg/dL   Calcium 9.0 8.9 - 95.6 mg/dL   Total Protein 6.3 (L) 6.5 - 8.1 g/dL   Albumin 3.1 (L) 3.5 - 5.0 g/dL   AST 18 15 - 41 U/L   ALT 16 0 - 44 U/L   Alkaline Phosphatase 141 (H) 38 - 126 U/L  Total Bilirubin 0.9 0.3 - 1.2 mg/dL   GFR calc non Af Amer >60 >60 mL/min   GFR calc Af Amer >60 >60 mL/min   Anion gap 9 5 - 15  Urinalysis, Routine w reflex microscopic     Status: Abnormal   Collection Time: 06/15/18  1:50 PM  Result Value Ref Range   Color, Urine YELLOW YELLOW   APPearance HAZY (A) CLEAR   Specific Gravity, Urine 1.033 (H) 1.005 - 1.030   pH 5.0 5.0 - 8.0   Glucose, UA NEGATIVE NEGATIVE mg/dL   Hgb urine dipstick LARGE (A) NEGATIVE   Bilirubin Urine NEGATIVE NEGATIVE   Ketones, ur 80 (A) NEGATIVE mg/dL   Protein, ur 30 (A) NEGATIVE mg/dL   Nitrite NEGATIVE NEGATIVE   Leukocytes, UA MODERATE (A) NEGATIVE   RBC / HPF 21-50 0 - 5 RBC/hpf   WBC, UA 21-50 0 - 5 WBC/hpf   Bacteria, UA NONE SEEN NONE SEEN   Squamous Epithelial / LPF 0-5 0 - 5   Mucus PRESENT     A: 9 days PP SVD Severe Preeclampsia (HA) Fever likely from Mastitis engourgement  P: Admit to 3rd floor per consult with Waynard Reedsoss, Kendra, MD. P:C ratio pending Mag Sulfate Percocet and IBU PRN Pump breasts  [name], [name],  CNM 06/15/2018 2:21 PM

## 2018-06-15 NOTE — MAU Note (Signed)
Headache started yesterday.. Took Ibuprofen, no relief, some relief when took oxycodone.  Denies visual change or light sensitivity.  Denies epigastric pain or increase in swelling.  Vag delivery 7/13.

## 2018-06-16 LAB — CBC
HEMATOCRIT: 30.9 % — AB (ref 36.0–46.0)
HEMOGLOBIN: 10 g/dL — AB (ref 12.0–15.0)
MCH: 25.3 pg — ABNORMAL LOW (ref 26.0–34.0)
MCHC: 32.4 g/dL (ref 30.0–36.0)
MCV: 78 fL (ref 78.0–100.0)
Platelets: 267 10*3/uL (ref 150–400)
RBC: 3.96 MIL/uL (ref 3.87–5.11)
RDW: 14.8 % (ref 11.5–15.5)
WBC: 7.3 10*3/uL (ref 4.0–10.5)

## 2018-06-16 MED ORDER — OXYCODONE HCL 5 MG PO TABS: 5.0000 mg | ORAL_TABLET | ORAL | Status: DC | PRN

## 2018-06-16 NOTE — Consult Note (Signed)
Patient delivered on 7/13 and was admitted yesterday to treat elevated B/P and mastitis of left breast.  She is on oral antibiotics.  Breast feels better today.  Mom is exclusively pumping 5-6 times per day and obtains 5 ounces each time.  She may try to latch baby to breast today.  Instructed to pump every 2-3 hours.  Encouraged to call for assist prn.

## 2018-06-16 NOTE — Progress Notes (Signed)
Patient admitted yesterday for concern for postpartum preeclampsia.  She presented with a severe HA and newly elevated BPs in the 130s-150s/90-110s.   She was started on magnesium sulfate, but was not started on any po / iv antihypertensive medications.  With magnesium, BPs quickly normalized.  HA resolved w one dose of percocet.  She was also noted to have an elevated temp (TMax 103F at 1530) and suspected mastitis.  She was started on keflex and the fever was noted to defervesce.   BP 115/71 (BP Location: Right Arm)   Pulse 79   Temp 97.9 F (36.6 C) (Oral)   Resp 18   Ht 5\' 6"  (1.676 m)   Wt 67.7 kg (149 lb 4 oz)   SpO2 99%   Breastfeeding? Yes Comment: going well, baby is doing well  BMI 24.09 kg/m   NAD Breast--still with mild streaking on left breast, minimally tender.  No masses.  Abd: soft, gravid, no tenderness Ext: no edema, DTR 1+ b/l  A&P: G1P1 PPD#10 with likely PP preeclampsia Continue magnesium sulfate for 24 hours Close BP monitoring following discontinuation of magnesium to eval for need for anti-hypertensive therapy Lactation to work with patient today--EP as baby not latching at home

## 2018-06-17 MED ORDER — CEPHALEXIN 500 MG PO CAPS: 500.0000 mg | ORAL_CAPSULE | Freq: Four times a day (QID) | ORAL | 0 refills | Status: AC

## 2018-06-17 MED ORDER — IBUPROFEN 600 MG PO TABS: 600.0000 mg | ORAL_TABLET | Freq: Four times a day (QID) | ORAL | 0 refills | Status: DC

## 2018-06-17 NOTE — Discharge Summary (Signed)
Physician Discharge Summary  Patient ID: Katie Brown MRN: [id number] DOB/AGE: 26/03/1991 26 y.o.  Admit date: 06/15/2018 Discharge date: 06/17/2018  Admission Diagnoses:pp preeclampsia and mastitis  Discharge Diagnoses: same Active Problems:   Pre-eclampsia in postpartum period   Discharged Condition: good  Hospital Course: Pt responded to both Magnesium sulfate for preeclampsia and keflex for mastitis.    Consults: None  Significant Diagnostic Studies: labs:   Results for orders placed or performed during the hospital encounter of 06/15/18 (from the past 72 hour(s))  CBC with Differential/Platelet     Status: Abnormal   Collection Time: 06/15/18  1:14 PM  Result Value Ref Range   WBC 10.3 4.0 - 10.5 K/uL   RBC 4.32 3.87 - 5.11 MIL/uL   Hemoglobin 10.9 (L) 12.0 - 15.0 g/dL   HCT 34.1 (L) 36.0 - 46.0 %   MCV 78.9 78.0 - 100.0 fL   MCH 25.2 (L) 26.0 - 34.0 pg   MCHC 32.0 30.0 - 36.0 g/dL   RDW 14.8 11.5 - 15.5 %   Platelets 268 150 - 400 K/uL   Neutrophils Relative % 79 %   Neutro Abs 8.2 (H) 1.7 - 7.7 K/uL   Lymphocytes Relative 14 %   Lymphs Abs 1.4 0.7 - 4.0 K/uL   Monocytes Relative 4 %   Monocytes Absolute 0.4 0.1 - 1.0 K/uL   Eosinophils Relative 3 %   Eosinophils Absolute 0.3 0.0 - 0.7 K/uL   Basophils Relative 0 %   Basophils Absolute 0.0 0.0 - 0.1 K/uL    Comment: Performed at [facility], [address], [place], [place] [address]  Comprehensive metabolic panel     Status: Abnormal   Collection Time: 06/15/18  1:14 PM  Result Value Ref Range   Sodium 136 135 - 145 mmol/L   Potassium 3.8 3.5 - 5.1 mmol/L   Chloride 104 98 - 111 mmol/L   CO2 23 22 - 32 mmol/L   Glucose, Bld 99 70 - 99 mg/dL   BUN 10 6 - 20 mg/dL   Creatinine, Ser 0.71 0.44 - 1.00 mg/dL   Calcium 9.0 8.9 - 10.3 mg/dL   Total Protein 6.3 (L) 6.5 - 8.1 g/dL   Albumin 3.1 (L) 3.5 - 5.0 g/dL   AST 18 15 - 41 U/L   ALT 16 0 - 44 U/L   Alkaline Phosphatase 141 (H) 38 - 126 U/L   Total  Bilirubin 0.9 0.3 - 1.2 mg/dL   GFR calc non Af Amer >60 >60 mL/min   GFR calc Af Amer >60 >60 mL/min    Comment: (NOTE) The eGFR has been calculated using the CKD EPI equation. This calculation has not been validated in all clinical situations. eGFR's persistently <60 mL/min signify possible Chronic Kidney Disease.    Anion gap 9 5 - 15    Comment: Performed at [facility], [address], [place], [place] [address]  Protein / creatinine ratio, urine     Status: Abnormal   Collection Time: 06/15/18  1:50 PM  Result Value Ref Range   Creatinine, Urine 272.00 mg/dL   Total Protein, Urine 104 mg/dL    Comment: NO NORMAL RANGE ESTABLISHED FOR THIS TEST   Protein Creatinine Ratio 0.38 (H) 0.00 - 0.15 mg/mg[Cre]    Comment: Performed at [facility], [address] [place],  [address]  Urinalysis, Routine w reflex microscopic     Status: Abnormal   Collection Time: 06/15/18  1:50 PM  Result Value Ref Range  Color, Urine YELLOW YELLOW   APPearance HAZY (A) CLEAR   Specific Gravity, Urine 1.033 (H) 1.005 - 1.030   pH 5.0 5.0 - 8.0   Glucose, UA NEGATIVE NEGATIVE mg/dL   Hgb urine dipstick LARGE (A) NEGATIVE   Bilirubin Urine NEGATIVE NEGATIVE   Ketones, ur 80 (A) NEGATIVE mg/dL   Protein, ur 30 (A) NEGATIVE mg/dL   Nitrite NEGATIVE NEGATIVE   Leukocytes, UA MODERATE (A) NEGATIVE   RBC / HPF 21-50 0 - 5 RBC/hpf   WBC, UA 21-50 0 - 5 WBC/hpf   Bacteria, UA NONE SEEN NONE SEEN   Squamous Epithelial / LPF 0-5 0 - 5   Mucus PRESENT     Comment: Performed at [facility], [address] [place], [place] [address]  CBC     Status: Abnormal   Collection Time: 06/16/18  5:13 AM  Result Value Ref Range   WBC 7.3 4.0 - 10.5 K/uL   RBC 3.96 3.87 - 5.11 MIL/uL   Hemoglobin 10.0 (L) 12.0 - 15.0 g/dL   HCT 30.9 (L) 36.0 - 46.0 %   MCV 78.0 78.0 - 100.0 fL   MCH 25.3 (L) 26.0 - 34.0 pg   MCHC 32.4 30.0 - 36.0 g/dL   RDW 14.8 11.5 - 15.5 %   Platelets 267 150 - 400  K/uL    Comment: Performed at [facility], [address], [place], [place] [address]    Treatments: antibiotics: Keflex and magnesium sulfate  Discharge Exam: Blood pressure 108/90, pulse 71, temperature (!) 97.5 F (36.4 C), temperature source Oral, resp. rate 17, height '5\' 6"'  (1.676 m), weight 149 lb 4 oz (67.7 kg), SpO2 99 %, currently breastfeeding.   Disposition: Discharge disposition: 01-Home or Self Care       Discharge Instructions    Call MD for:  temperature >100.4   Complete by:  As directed    Diet - low sodium heart healthy   Complete by:  As directed    Discharge instructions   Complete by:  As directed    Routine activity.  Take over the counter ibuprophen up to 800 mg every 8 hours for pain.   Discharge wound care:   Complete by:  As directed    Sitz baths and icepacks to perineum.  If stitches, they will dissolve.   Sexual acrtivity   Complete by:  As directed    Nothing per vagina for 6 weeks.     Allergies as of 06/17/2018   No Known Allergies     Medication List    STOP taking these medications   oxyCODONE-acetaminophen 5-325 MG tablet Commonly known as:  PERCOCET/ROXICET     TAKE these medications   cephALEXin 500 MG capsule Commonly known as:  KEFLEX Take 1 capsule (500 mg total) by mouth every 6 (six) hours for 4 days.   docusate sodium 100 MG capsule Commonly known as:  COLACE Take 1 capsule (100 mg total) by mouth 2 (two) times daily.   ibuprofen 600 MG tablet Commonly known as:  ADVIL,MOTRIN Take 1 tablet (600 mg total) by mouth every 6 (six) hours. What changed:    when to take this  reasons to take this            Discharge Care Instructions  (From admission, onward)        Start     Ordered   06/17/18 0000  Discharge wound care:    Comments:  Sitz baths and icepacks to perineum.  If stitches, they will dissolve.   06/17/18 0730     Follow-up Information    Ob/Gyn, [facility] Follow up in 2 week(s).    Contact information: [place] [address] [phone]           Signed: Daria Pastures 06/17/2018, 7:31 AM

## 2018-06-17 NOTE — Progress Notes (Signed)
Patient is eating, ambulating, voiding.  Pain control is good.  Vitals:   06/16/18 1722 06/16/18 1936 06/16/18 2358 06/17/18 0412  BP: 114/90 (!) 123/94 116/72 108/90  Pulse: 90 89 79 71  Resp: 19 18 16 17   Temp:  98.6 F (37 C) 98 F (36.7 C) (!) 97.5 F (36.4 C)  TempSrc:  Oral Oral Oral  SpO2: 99% 100% 100% 99%  Weight:      Height:        Fundus firm Perineum without swelling.  Lab Results  Component Value Date   WBC 7.3 06/16/2018   HGB 10.0 (L) 06/16/2018   HCT 30.9 (L) 06/16/2018   MCV 78.0 06/16/2018   PLT 267 06/16/2018      A/P Post partum day 11 with mastitis and pp preeclampsia.  BPs returned to normal post 24 hours of magnesium. Pt is responding to keflex.  Routine care.  Expect d/c today.Marland Kitchen.    Gerene Nedd A

## 2018-08-13 ENCOUNTER — Encounter

## 2018-08-13 ENCOUNTER — Encounter: Payer: Self-pay | Admitting: Internal Medicine

## 2018-08-13 ENCOUNTER — Ambulatory Visit (INDEPENDENT_AMBULATORY_CARE_PROVIDER_SITE_OTHER): Payer: Federal, State, Local not specified - PPO | Admitting: Internal Medicine

## 2018-08-13 VITALS — BP 100/67 | HR 74 | Ht 65.0 in | Wt 149.0 lb

## 2018-08-13 DIAGNOSIS — R0789 Other chest pain: Secondary | ICD-10-CM | POA: Insufficient documentation

## 2018-08-13 DIAGNOSIS — K219 Gastro-esophageal reflux disease without esophagitis: Secondary | ICD-10-CM | POA: Insufficient documentation

## 2018-08-13 NOTE — Progress Notes (Signed)
OFFICE CONSULT NOTE  Chief Complaint:  Chest pain  Primary Care Physician: Patient, No Pcp Per  HPI:  Katie Brown is a 27 y.o. female who is being seen today for the evaluation of chest pain at the request of Katie Reeds, MD.  This is a pleasant 27 year old female who is kindly referred for evaluation of chest pain.  She is actually only a few months postpartum.  This is her first baby.  Apparently during her pregnancy was complicated with some preeclampsia.  There is a history of hypertension in the family.  In addition, she suffered from acid reflux.  She is mentioned that she recently had an episode where she was awakened from sleep early in the morning and had a feeling of tightness and burning in her chest.  She was also nauseated and somewhat diaphoretic.  She was seen by EMS and EKG was normal.  It was felt that she might have had an anxiety attack.  Subsequently she saw her OB/GYN who recommended cardiology referral.  She denies any history of worsening shortness of breath, orthopnea, PND, swelling or any heart failure symptoms.  She is not that physically active although caring for her young baby.  Prior to the pregnancy she had no cardiac symptoms.  There is no significant history of early onset family heart disease.  EKG personally reviewed today shows sinus rhythm at 74.  PMHx:  Past Medical History:  Diagnosis Date  . Medical history non-contributory   . Scoliosis     Past Surgical History:  Procedure Laterality Date  . NO PAST SURGERIES      FAMHx:  Family History  Problem Relation Age of Onset  . Hypertension Mother   . Diabetes Maternal Grandmother   . Diabetes Maternal Grandfather     SOCHx:   reports that she has never smoked. She has never used smokeless tobacco. She reports that she does not drink alcohol or use drugs.  ALLERGIES:  No Known Allergies  ROS: Pertinent items noted in HPI and remainder of comprehensive ROS otherwise negative.  HOME  MEDS: Current Outpatient Medications on File Prior to Visit  Medication Sig Dispense Refill  . docusate sodium (COLACE) 100 MG capsule Take 1 capsule (100 mg total) by mouth 2 (two) times daily. 60 capsule 0  . ibuprofen (ADVIL,MOTRIN) 600 MG tablet Take 1 tablet (600 mg total) by mouth every 6 (six) hours. 30 tablet 0   No current facility-administered medications on file prior to visit.     LABS/IMAGING: No results found for this or any previous visit (from the past 48 hour(s)). No results found.  LIPID PANEL: No results found for: CHOL, TRIG, HDL, CHOLHDL, VLDL, LDLCALC, LDLDIRECT  WEIGHTS: Wt Readings from Last 3 Encounters:  08/13/18 149 lb (67.6 kg)  06/15/18 149 lb 4 oz (67.7 kg)  06/04/18 163 lb 1.6 oz (74 kg)    VITALS: BP 100/67   Pulse 74   Ht 5\' 5"  (1.651 m)   Wt 149 lb (67.6 kg)   SpO2 100%   BMI 24.79 kg/m   EXAM: General appearance: alert and no distress Neck: no carotid bruit, no JVD and thyroid not enlarged, symmetric, no tenderness/mass/nodules Lungs: clear to auscultation bilaterally Heart: regular rate and rhythm Abdomen: soft, non-tender; bowel sounds normal; no masses,  no organomegaly Extremities: extremities normal, atraumatic, no cyanosis or edema Pulses: 2+ and symmetric Skin: Skin color, texture, turgor normal. No rashes or lesions Neurologic: Grossly normal Psych: Pleasant  EKG: Normal sinus  rhythm at 74- personally reviewed  ASSESSMENT: 1. Atypical chest pain-suspect GERD  PLAN: 1.   Ms. Katie Brown has a description of atypical chest pain, most likely GERD.  She had a problem with this during her pregnancy.  She could try Tums or H2 blocker.  She is not breast-feeding and therefore would be considered safe to take medication as needed.  Her EKG during her episode of chest discomfort showed no acute ST segment elevation as would be expected with an acute coronary dissection.  Otherwise atherosclerosis and the development of cardiovascular  disease in a young age is atypical.  She has no signs or symptoms of heart failure as would be expected with peripartum cardiomyopathy.  I do not feel that additional testing is necessary at this time.  Should she had any further symptoms we could at most consider exercise treadmill stress testing.  Next again for the kind referral.  Follow-up with me as needed.  Katie NoseKenneth C. Shantina Chronister, MD, [facility] Community HospitalFACC, FACP  [place]  [facility]  Medical Director of the [facility]  [facility] Diplomate of the American Board of Clinical Lipidology Attending Cardiologist  Direct Dial: [phone]  Fax: [phone]  Website:  [email or web address]  Katie Brown 08/13/2018, 4:11 PM

## 2018-08-13 NOTE — Patient Instructions (Signed)
Your physician recommends that you schedule a follow-up appointment as needed.   Dr. Rennis GoldenHilty recommends Pepcid over the counter for reflux symptoms

## 2019-09-23 ENCOUNTER — Encounter (HOSPITAL_COMMUNITY): Payer: Self-pay

## 2019-09-23 ENCOUNTER — Other Ambulatory Visit: Payer: Self-pay

## 2019-09-23 ENCOUNTER — Ambulatory Visit (HOSPITAL_COMMUNITY)
Admission: EM | Admit: 2019-09-23 | Discharge: 2019-09-23 | Disposition: A | Discharge status: Home / Self Care | Payer: Medicaid Other | Attending: Family Medicine | Admitting: Family Medicine

## 2019-09-23 DIAGNOSIS — Z3202 Encounter for pregnancy test, result negative: Secondary | ICD-10-CM | POA: Diagnosis not present

## 2019-09-23 DIAGNOSIS — N939 Abnormal uterine and vaginal bleeding, unspecified: Secondary | ICD-10-CM | POA: Diagnosis not present

## 2019-09-23 LAB — POCT PREGNANCY, URINE: Preg Test, Ur: NEGATIVE

## 2019-09-23 NOTE — ED Triage Notes (Signed)
Pt states she started spotting 3 days ago. Pt states she started having vaginal bleeding today. Pt states her period will start in 5 days. Pt denies any abdominal pain ir cramps. Pt states she wants to know if this bleeding is related to an infection or is just her overreacting.

## 2019-09-23 NOTE — Discharge Instructions (Signed)
We are checking swab for gonorrhea, chlamydia, trich, yeast and bacterial vaginosis  Please monitor for bleeding to resolve like normal cycle  Follow up if developing pain or excessive prolonged heavy bleeding, lightheadedness, dizziness  Follow up with OBGYN if continuing to have irregular bleeding
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
## 2019-09-27 ENCOUNTER — Telehealth (HOSPITAL_COMMUNITY): Payer: Self-pay | Admitting: Emergency Medicine

## 2019-09-27 ENCOUNTER — Ambulatory Visit (HOSPITAL_COMMUNITY)
Admission: EM | Admit: 2019-09-27 | Discharge: 2019-09-27 | Disposition: A | Discharge status: Home / Self Care | Payer: Medicaid Other | Attending: Family Medicine | Admitting: Family Medicine

## 2019-09-27 DIAGNOSIS — A549 Gonococcal infection, unspecified: Secondary | ICD-10-CM | POA: Diagnosis not present

## 2019-09-27 LAB — CERVICOVAGINAL ANCILLARY ONLY
Bacterial vaginitis: NEGATIVE
Candida vaginitis: NEGATIVE
Chlamydia: POSITIVE — AB
Neisseria Gonorrhea: POSITIVE — AB
Trichomonas: POSITIVE — AB

## 2019-09-27 MED ORDER — CEFTRIAXONE SODIUM 250 MG IJ SOLR
INTRAMUSCULAR | Status: AC
Start: 2019-09-27 — End: ?
  Filled 2019-09-27: qty 250

## 2019-09-27 MED ORDER — METRONIDAZOLE 500 MG PO TABS
2000.0000 mg | ORAL_TABLET | Freq: Once | ORAL | Status: AC
  Administered 2019-09-27: 18:00:00 2000 mg via ORAL

## 2019-09-27 MED ORDER — AZITHROMYCIN 250 MG PO TABS
ORAL_TABLET | ORAL | Status: AC
  Filled 2019-09-27: qty 4

## 2019-09-27 MED ORDER — AZITHROMYCIN 250 MG PO TABS
1000.0000 mg | ORAL_TABLET | Freq: Once | ORAL | Status: AC
  Administered 2019-09-27: 18:00:00 1000 mg via ORAL

## 2019-09-27 MED ORDER — METRONIDAZOLE 500 MG PO TABS
ORAL_TABLET | ORAL | Status: AC
  Filled 2019-09-27: qty 4

## 2019-09-27 MED ORDER — CEFTRIAXONE SODIUM 250 MG IJ SOLR
250.0000 mg | Freq: Once | INTRAMUSCULAR | Status: AC
  Administered 2019-09-27: 250 mg via INTRAMUSCULAR

## 2019-09-27 NOTE — ED Triage Notes (Signed)
Pt presents for STD treatment per standing order and Dr Mannie Stabile.

## 2019-09-27 NOTE — Telephone Encounter (Signed)
Pt contacted and made aware of positive gon, chla, and trich, will return today for treatment for all three.

## 2019-09-30 ENCOUNTER — Telehealth (HOSPITAL_COMMUNITY): Payer: Self-pay | Admitting: Emergency Medicine

## 2019-09-30 MED ORDER — AZITHROMYCIN 250 MG PO TABS: 1000.0000 mg | ORAL_TABLET | Freq: Once | ORAL | 0 refills | Status: AC

## 2019-09-30 MED ORDER — METRONIDAZOLE 500 MG PO TABS: 2000.0000 mg | ORAL_TABLET | Freq: Once | ORAL | 0 refills | Status: AC

## 2019-09-30 NOTE — Telephone Encounter (Signed)
Pt called stated she was worried since she threw up 1.5 hour after she took the medicine on 11/03. Per Dr. Joseph Art, okay to send repeat doze of zithromax and flagyl to be sure pt fully treated. Pt verbalized understanding, all questions answered.

## 2019-11-04 DIAGNOSIS — Z113 Encounter for screening for infections with a predominantly sexual mode of transmission: Secondary | ICD-10-CM | POA: Diagnosis not present

## 2019-11-04 DIAGNOSIS — Z114 Encounter for screening for human immunodeficiency virus [HIV]: Secondary | ICD-10-CM | POA: Diagnosis not present

## 2019-11-14 DIAGNOSIS — A5402 Gonococcal vulvovaginitis, unspecified: Secondary | ICD-10-CM | POA: Diagnosis not present

## 2019-11-26 DIAGNOSIS — J324 Chronic pansinusitis: Secondary | ICD-10-CM | POA: Diagnosis not present

## 2019-11-26 DIAGNOSIS — Z20828 Contact with and (suspected) exposure to other viral communicable diseases: Secondary | ICD-10-CM | POA: Diagnosis not present

## 2020-04-09 DIAGNOSIS — Z113 Encounter for screening for infections with a predominantly sexual mode of transmission: Secondary | ICD-10-CM | POA: Diagnosis not present

## 2020-04-09 LAB — HIV ANTIBODY (ROUTINE TESTING W REFLEX): HIV 1&2 Ab, 4th Generation: NEGATIVE

## 2020-07-04 ENCOUNTER — Ambulatory Visit
Admission: EM | Admit: 2020-07-04 | Discharge: 2020-07-04 | Disposition: A | Discharge status: Home / Self Care | Payer: Medicaid Other

## 2020-07-04 DIAGNOSIS — L02416 Cutaneous abscess of left lower limb: Secondary | ICD-10-CM

## 2020-07-04 MED ORDER — DOXYCYCLINE HYCLATE 100 MG PO CAPS: 100.0000 mg | ORAL_CAPSULE | Freq: Two times a day (BID) | ORAL | 0 refills | Status: DC
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
## 2020-07-04 NOTE — Discharge Instructions (Signed)
Start doxycycline as directed. Warm compresses. As discussed, if symptoms not improving, or worsening swelling, will need drainage.

## 2020-07-04 NOTE — ED Triage Notes (Signed)
Pt c/o ongoing abscesses to various hair follicles; new abscesses to right/left inner thigh and under left axilla. Right inner thigh has drained white purulence matter. Left inner thigh with large area approx 6cm diameter of edema with mild induration.  Denies fever, chills, n/v/d.

## 2020-07-30 ENCOUNTER — Ambulatory Visit
Admission: EM | Admit: 2020-07-30 | Discharge: 2020-07-30 | Disposition: A | Discharge status: Home / Self Care | Payer: Medicaid Other

## 2020-07-30 ENCOUNTER — Other Ambulatory Visit: Payer: Self-pay

## 2020-07-30 NOTE — ED Notes (Signed)
Patient answered phone, no longer on premises.

## 2020-07-30 NOTE — ED Notes (Signed)
Called no answer

## 2020-07-30 NOTE — ED Notes (Signed)
Called, no answer.

## 2020-08-19 ENCOUNTER — Other Ambulatory Visit: Payer: Self-pay

## 2020-08-19 ENCOUNTER — Ambulatory Visit
Admission: EM | Admit: 2020-08-19 | Discharge: 2020-08-19 | Disposition: A | Discharge status: Home / Self Care | Payer: Medicaid Other | Attending: Emergency Medicine | Admitting: Emergency Medicine

## 2020-08-19 DIAGNOSIS — R1030 Lower abdominal pain, unspecified: Secondary | ICD-10-CM | POA: Insufficient documentation

## 2020-08-19 DIAGNOSIS — N939 Abnormal uterine and vaginal bleeding, unspecified: Secondary | ICD-10-CM | POA: Insufficient documentation

## 2020-08-19 LAB — POCT URINALYSIS DIP (MANUAL ENTRY)
Bilirubin, UA: NEGATIVE
Glucose, UA: NEGATIVE mg/dL
Ketones, POC UA: NEGATIVE mg/dL
Leukocytes, UA: NEGATIVE
Nitrite, UA: NEGATIVE
Protein Ur, POC: NEGATIVE mg/dL
Spec Grav, UA: 1.025 (ref 1.010–1.025)
Urobilinogen, UA: 1 E.U./dL
pH, UA: 7.5 (ref 5.0–8.0)

## 2020-08-19 LAB — POCT URINE PREGNANCY: Preg Test, Ur: NEGATIVE

## 2020-08-19 NOTE — Discharge Instructions (Addendum)
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
## 2020-08-19 NOTE — ED Triage Notes (Signed)
Pt is here for evaluation of vaginal spotting that she noticed yesterday accompanied by mild abdominal pain with discharge.  No urinary discomfort.

## 2020-08-21 LAB — CERVICOVAGINAL ANCILLARY ONLY
Bacterial Vaginitis (gardnerella): POSITIVE — AB
Chlamydia: NEGATIVE
Comment: NEGATIVE
Comment: NEGATIVE
Comment: NEGATIVE
Comment: NORMAL
Neisseria Gonorrhea: NEGATIVE
Trichomonas: NEGATIVE

## 2020-08-22 ENCOUNTER — Telehealth (HOSPITAL_COMMUNITY): Payer: Self-pay | Admitting: Emergency Medicine

## 2020-08-22 MED ORDER — METRONIDAZOLE 0.75 % VA GEL: 1.0000 | Freq: Every day | VAGINAL | 0 refills | Status: AC

## 2020-09-10 DIAGNOSIS — Z113 Encounter for screening for infections with a predominantly sexual mode of transmission: Secondary | ICD-10-CM | POA: Diagnosis not present

## 2020-09-18 ENCOUNTER — Ambulatory Visit (INDEPENDENT_AMBULATORY_CARE_PROVIDER_SITE_OTHER): Payer: Medicaid Other | Admitting: Physician Assistant

## 2020-09-18 ENCOUNTER — Encounter: Payer: Self-pay | Admitting: Physician Assistant

## 2020-09-18 ENCOUNTER — Other Ambulatory Visit: Payer: Self-pay

## 2020-09-18 VITALS — BP 110/80 | HR 107 | Temp 98.0°F | Resp 16 | Ht 66.0 in | Wt 156.0 lb

## 2020-09-18 DIAGNOSIS — L732 Hidradenitis suppurativa: Secondary | ICD-10-CM

## 2020-09-18 MED ORDER — CLINDAMYCIN PHOSPHATE 1 % EX SOLN: CUTANEOUS | 0 refills | Status: DC

## 2020-09-18 NOTE — Progress Notes (Signed)
Patient presents to clinic today to establish care.  Patient endorses longstanding history of episodic boils of her inner thighs bilaterally.  Has had similar episodes of her axillary region but this is very rare.  Was recently evaluated at [facility] where she had been receiving her care and was given a diagnosis of hidradenitis suppurativa.  Was encouraged to follow-up with her primary care provider for further evaluation and management.  Currently patient denies any painful lesions.  States she is very self-conscious about the scarring and darkening of skin that is left behind from these episodes.  Is wanting to know if there is a way to help prevent flareups.  Health Maintenance: Immunizations -- Records requested.  PAP -- Endorses up-to-date. Last 2 weeks ago. Normal per patient.   Past Medical History:  Diagnosis Date  . Medical history non-contributory   . Scoliosis     Past Surgical History:  Procedure Laterality Date  . NO PAST SURGERIES    . VAGINAL DELIVERY  2019    No current outpatient medications on file prior to visit.   No current facility-administered medications on file prior to visit.    No Known Allergies  Family History  Problem Relation Age of Onset  . Hypertension Mother   . Diabetes Maternal Grandmother   . Diabetes Maternal Grandfather     Social History   Socioeconomic History  . Marital status: Single    Spouse name: Not on file  . Number of children: Not on file  . Years of education: Not on file  . Highest education level: Not on file  Occupational History  . Occupation: Teaher Assitant  Tobacco Use  . Smoking status: Former Smoker    Types: Cigarettes  . Smokeless tobacco: Never Used  Vaping Use  . Vaping Use: Never used  Substance and Sexual Activity  . Alcohol use: Not Currently  . Drug use: Not Currently    Types: Marijuana  . Sexual activity: Yes    Birth control/protection: None  Other Topics Concern  . Not on  file  Social History Narrative  . Not on file   Social Determinants of Health   Financial Resource Strain:   . Difficulty of Paying Living Expenses: Not on file  Food Insecurity:   . Worried About Programme researcher, broadcasting/film/video in the Last Year: Not on file  . Ran Out of Food in the Last Year: Not on file  Transportation Needs:   . Lack of Transportation (Medical): Not on file  . Lack of Transportation (Non-Medical): Not on file  Physical Activity:   . Days of Exercise per Week: Not on file  . Minutes of Exercise per Session: Not on file  Stress:   . Feeling of Stress : Not on file  Social Connections:   . Frequency of Communication with Friends and Family: Not on file  . Frequency of Social Gatherings with Friends and Family: Not on file  . Attends Religious Services: Not on file  . Active Member of Clubs or Organizations: Not on file  . Attends Banker Meetings: Not on file  . Marital Status: Not on file  Intimate Partner Violence:   . Fear of Current or Ex-Partner: Not on file  . Emotionally Abused: Not on file  . Physically Abused: Not on file  . Sexually Abused: Not on file   ROS Pertinent ROS are listed in the HPI.   BP 110/80   Pulse (!) 107   Temp 98  F (36.7 C) (Temporal)   Resp 16   Ht 5\' 6"  (1.676 m)   Wt 156 lb (70.8 kg)   LMP 09/16/2020 (LMP Unknown)   SpO2 99%   Breastfeeding No   BMI 25.18 kg/m   Physical Exam Vitals reviewed. Exam conducted with a chaperone present.  Constitutional:      Appearance: Normal appearance.  HENT:     Head: Normocephalic and atraumatic.  Cardiovascular:     Rate and Rhythm: Normal rate and regular rhythm.     Pulses: Normal pulses.     Heart sounds: Normal heart sounds.  Pulmonary:     Effort: Pulmonary effort is normal.     Breath sounds: Normal breath sounds.  Abdominal:     General: Bowel sounds are normal. There is no distension.     Palpations: Abdomen is soft.     Tenderness: There is no abdominal  tenderness.  Musculoskeletal:     Cervical back: Neck supple.  Skin:      Neurological:     General: No focal deficit present.     Mental Status: She is alert and oriented to person, place, and time.     Recent Results (from the past 2160 hour(s))  POCT urinalysis dipstick     Status: Abnormal   Collection Time: 08/19/20 12:42 PM  Result Value Ref Range   Color, UA yellow yellow   Clarity, UA clear clear   Glucose, UA negative negative mg/dL   Bilirubin, UA negative negative   Ketones, POC UA negative negative mg/dL   Spec Grav, UA 08/21/20 1.157 - 1.025   Blood, UA small (A) negative   pH, UA 7.5 5.0 - 8.0   Protein Ur, POC negative negative mg/dL   Urobilinogen, UA 1.0 0.2 or 1.0 E.U./dL   Nitrite, UA Negative Negative   Leukocytes, UA Negative Negative  POCT urine pregnancy     Status: None   Collection Time: 08/19/20 12:42 PM  Result Value Ref Range   Preg Test, Ur Negative Negative  Cervicovaginal ancillary only     Status: Abnormal   Collection Time: 08/19/20  1:03 PM  Result Value Ref Range   Neisseria Gonorrhea Negative    Chlamydia Negative    Trichomonas Negative    Bacterial Vaginitis (gardnerella) Positive (A)    Comment      Normal Reference Range Bacterial Vaginosis - Negative   Comment Normal Reference Ranger Chlamydia - Negative    Comment      Normal Reference Range Neisseria Gonorrhea - Negative   Comment Normal Reference Range Trichomonas - Negative     Assessment/Plan: 1. Hidradenitis suppurativa No active abscess.  Skin overall healthy looking except for evidence of prior inflammation and scarring.  Would characterize as mild to moderate disease given the size of postinflammatory hyperpigmentation.  Discussed proper skin care.  Recommend once weekly Hibiclens wiped in the area.  Will Rx Cleocin 1% external solution to apply twice daily for 3-4 weeks to see if we can calm down disease state and prevent further flares for her. IF no improvement and  having continued flares would need to consider Spironolactone or Metformin added to help. Can consider Derm referral but she wants to hold off for now.  - clindamycin (CLEOCIN T) 1 % external solution; SMARTSIG:Sparingly Topical Twice Daily for 1 month  Dispense: 60 mL; Refill: 0  This visit occurred during the SARS-CoV-2 public health emergency.  Safety protocols were in place, including screening questions prior to the visit,  additional usage of staff PPE, and extensive cleaning of exam room while observing appropriate contact time as indicated for disinfecting solutions.     Piedad Climes, PA-C

## 2020-09-18 NOTE — Patient Instructions (Signed)
Keep skin clean and dry.  Wear breathable materials -- cotton, etc.  Apply the Cleocin solution twice daily (small amount) for 1 month. Also get OTC hibiclens solution to apply once weekly. Avoid scented and dyed soaps/lotions -- recommend Cetaphil.   Follow-up in 3-4 weeks for reassessment. If no substantial improvement we will need to consider addition of other medications and getting you in with Dermatology.   Hidradenitis Suppurativa Hidradenitis suppurativa is a long-term (chronic) skin disease. It is similar to a severe form of acne, but it affects areas of the body where acne would be unusual, especially areas of the body where skin rubs against skin and becomes moist. These include:  Underarms.  Groin.  Genital area.  Buttocks.  Upper thighs.  Breasts. Hidradenitis suppurativa may start out as small lumps or pimples caused by blocked sweat glands or hair follicles. Pimples may develop into deep sores that break open (rupture) and drain pus. Over time, affected areas of skin may thicken and become scarred. This condition is rare and does not spread from person to person (non-contagious). What are the causes? The exact cause of this condition is not known. It may be related to:  Female and female hormones.  An overactive disease-fighting system (immune system). The immune system may over-react to blocked hair follicles or sweat glands and cause swelling and pus-filled sores. What increases the risk? You are more likely to develop this condition if you:  Are female.  Are 49-80 years old.  Have a family history of hidradenitis suppurativa.  Have a personal history of acne.  Are overweight.  Smoke.  Take the medicine lithium. What are the signs or symptoms? The first symptoms are usually painful bumps in the skin, similar to pimples. The condition may get worse over time (progress), or it may only cause mild symptoms. If the disease progresses, symptoms may  include:  Skin bumps getting bigger and growing deeper into the skin.  Bumps rupturing and draining pus.  Itchy, infected skin.  Skin getting thicker and scarred.  Tunnels under the skin (fistulas) where pus drains from a bump.  Pain during daily activities, such as pain during walking if your groin area is affected.  Emotional problems, such as stress or depression. This condition may affect your appearance and your ability or willingness to wear certain clothes or do certain activities. How is this diagnosed? This condition is diagnosed by a health care provider who specializes in skin diseases (dermatologist). You may be diagnosed based on:  Your symptoms and medical history.  A physical exam.  Testing a pus sample for infection.  Blood tests. How is this treated? Your treatment will depend on how severe your symptoms are. The same treatment will not work for everybody with this condition. You may need to try several treatments to find what works best for you. Treatment may include:  Cleaning and bandaging (dressing) your wounds as needed.  Lifestyle changes, such as new skin care routines.  Taking medicines, such as: ? Antibiotics. ? Acne medicines. ? Medicines to reduce the activity of the immune system. ? A diabetes medicine (metformin). ? Birth control pills, for women. ? Steroids to reduce swelling and pain.  Working with a mental health care provider, if you experience emotional distress due to this condition. If you have severe symptoms that do not get better with medicine, you may need surgery. Surgery may involve:  Using a laser to clear the skin and remove hair follicles.  Opening and draining deep  sores.  Removing the areas of skin that are diseased and scarred. Follow these instructions at home: Medicines   Take over-the-counter and prescription medicines only as told by your health care provider.  If you were prescribed an antibiotic medicine,  take it as told by your health care provider. Do not stop taking the antibiotic even if your condition improves. Skin care  If you have open wounds, cover them with a clean dressing as told by your health care provider. Keep wounds clean by washing them gently with soap and water when you bathe.  Do not shave the areas where you get hidradenitis suppurativa.  Do not wear deodorant.  Wear loose-fitting clothes.  Try to avoid getting overheated or sweaty. If you get sweaty or wet, change into clean, dry clothes as soon as you can.  To help relieve pain and itchiness, cover sore areas with a warm, clean washcloth (warm compress) for 5-10 minutes as often as needed.  If told by your health care provider, take a bleach bath twice a week: ? Fill your bathtub halfway with water. ? Pour in  cup of unscented household bleach. ? Soak in the tub for 5-10 minutes. ? Only soak from the neck down. Avoid water on your face and hair. ? Shower to rinse off the bleach from your skin. General instructions  Learn as much as you can about your disease so that you have an active role in your treatment. Work closely with your health care provider to find treatments that work for you.  If you are overweight, work with your health care provider to lose weight as recommended.  Do not use any products that contain nicotine or tobacco, such as cigarettes and e-cigarettes. If you need help quitting, ask your health care provider.  If you struggle with living with this condition, talk with your health care provider or work with a mental health care provider as recommended.  Keep all follow-up visits as told by your health care provider. This is important. Where to find more information  Hidradenitis Suppurativa Foundation, Inc.: https://www.hs-foundation.org/ Contact a health care provider if you have:  A flare-up of hidradenitis suppurativa.  A fever or chills.  Trouble controlling your symptoms at  home.  Trouble doing your daily activities because of your symptoms.  Trouble dealing with emotional problems related to your condition. Summary  Hidradenitis suppurativa is a long-term (chronic) skin disease. It is similar to a severe form of acne, but it affects areas of the body where acne would be unusual.  The first symptoms are usually painful bumps in the skin, similar to pimples. The condition may get worse over time (progress), or it may only cause mild symptoms.  If you have open wounds, cover them with a clean dressing as told by your health care provider. Keep wounds clean by washing them gently with soap and water when you bathe.  Besides skin care, treatment may include medicines, laser treatment, and surgery. This information is not intended to replace advice given to you by your health care provider. Make sure you discuss any questions you have with your health care provider. Document Revised: 11/18/2017 Document Reviewed: 11/18/2017 Elsevier Patient Education  2020 [employer].

## 2020-10-08 ENCOUNTER — Encounter: Payer: Self-pay | Admitting: Physician Assistant

## 2020-10-16 ENCOUNTER — Ambulatory Visit: Payer: Medicaid Other | Admitting: Physician Assistant

## 2020-11-07 ENCOUNTER — Ambulatory Visit: Payer: Medicaid Other | Admitting: Physician Assistant

## 2020-11-12 ENCOUNTER — Other Ambulatory Visit: Payer: Self-pay

## 2020-11-12 ENCOUNTER — Ambulatory Visit (INDEPENDENT_AMBULATORY_CARE_PROVIDER_SITE_OTHER): Payer: Medicaid Other | Admitting: Physician Assistant

## 2020-11-12 ENCOUNTER — Encounter: Payer: Self-pay | Admitting: Physician Assistant

## 2020-11-12 ENCOUNTER — Other Ambulatory Visit (HOSPITAL_COMMUNITY)
Admission: RE | Admit: 2020-11-12 | Discharge: 2020-11-12 | Disposition: A | Discharge status: Home / Self Care | Payer: Medicaid Other | Source: Ambulatory Visit | Attending: Physician Assistant | Admitting: Physician Assistant

## 2020-11-12 VITALS — BP 110/70 | HR 78 | Temp 98.4°F | Resp 14 | Ht 66.0 in | Wt 164.0 lb

## 2020-11-12 DIAGNOSIS — N760 Acute vaginitis: Secondary | ICD-10-CM

## 2020-11-12 DIAGNOSIS — L732 Hidradenitis suppurativa: Secondary | ICD-10-CM | POA: Diagnosis not present

## 2020-11-12 MED ORDER — METRONIDAZOLE 0.75 % EX GEL: CUTANEOUS | 0 refills | Status: DC

## 2020-11-12 MED ORDER — METRONIDAZOLE 500 MG PO TABS: 500.0000 mg | ORAL_TABLET | Freq: Two times a day (BID) | ORAL | 0 refills | Status: DC

## 2020-11-12 NOTE — Patient Instructions (Signed)
Please keep skin clean and dry. You can stop the Cleocin topically to the skin for now. Get the Hibiclens OTC and apply once weekly.  For vaginal discharge, this seems most consistent with BV. As such I am start you on metronidazole twice daily for 7 days. No alcohol while on this medication. We are sending your swab for further testing just to make sure there are no other concerns. Avoid any sexual activity until results and appropriate treatment has been given.

## 2020-11-12 NOTE — Progress Notes (Signed)
Patient presents to clinic today to follow-up regarding inguinal hidradenitis suppurativa after started on a course of topical Cleocin and home care instructions were reviewed.  Patient endorses taking the medicine as directed.  Denies any new flareups of her symptoms but still noting significant darkening and scarring of the skin.  Patient also endorses 2 weeks of vaginal discharge associated with occasional vaginal irritation.  Does note recently having a new sexual partner prior to symptom onset.  Denies any vaginal pain or dyspareunia.  Denies fever, chills, malaise or fatigue.  Does note fishy odor to her discharge.  Has history of BV and states this feels similar to her.  Past Medical History:  Diagnosis Date   Medical history non-contributory    Scoliosis     Current Outpatient Medications on File Prior to Visit  Medication Sig Dispense Refill   clindamycin (CLEOCIN T) 1 % external solution SMARTSIG:Sparingly Topical Twice Daily for 1 month 60 mL 0   chlorhexidine (HIBICLENS) 4 % external liquid Apply 1 application topically daily as needed. (Patient not taking: Reported on 11/12/2020)     No current facility-administered medications on file prior to visit.    No Known Allergies  Family History  Problem Relation Age of Onset   Hypertension Mother    Diabetes Maternal Grandmother    Diabetes Maternal Grandfather     Social History   Socioeconomic History   Marital status: Single    Spouse name: Not on file   Number of children: Not on file   Years of education: Not on file   Highest education level: Not on file  Occupational History   Occupation: Teaher Assitant  Tobacco Use   Smoking status: Former Smoker    Types: Cigarettes   Smokeless tobacco: Never Used  Building services engineer Use: Never used  Substance and Sexual Activity   Alcohol use: Not Currently   Drug use: Not Currently    Types: Marijuana   Sexual activity: Yes    Birth  control/protection: None  Other Topics Concern   Not on file  Social History Narrative   Not on file   Social Determinants of Health   Financial Resource Strain: Not on file  Food Insecurity: Not on file  Transportation Needs: Not on file  Physical Activity: Not on file  Stress: Not on file  Social Connections: Not on file   Review of Systems - See HPI.  All other ROS are negative.  BP 110/70    Pulse 78    Temp 98.4 F (36.9 C) (Temporal)    Resp 14    Ht 5\' 6"  (1.676 m)    Wt 164 lb (74.4 kg)    SpO2 98%    BMI 26.47 kg/m   Physical Exam Vitals reviewed. Exam conducted with a chaperone present.  Constitutional:      Appearance: Normal appearance.  HENT:     Head: Normocephalic and atraumatic.  Cardiovascular:     Rate and Rhythm: Normal rate and regular rhythm.  Pulmonary:     Effort: Pulmonary effort is normal.  Abdominal:     Hernia: There is no hernia in the left inguinal area or right inguinal area.  Genitourinary:    General: Normal vulva.     Exam position: Supine.     Labia:        Right: No rash or tenderness.        Left: No rash or tenderness.      Vagina: No  foreign body. Vaginal discharge present. No erythema, tenderness, bleeding, lesions or prolapsed vaginal walls.     Cervix: Normal.     Uterus: Normal.      Adnexa: Right adnexa normal and left adnexa normal.    Musculoskeletal:     Cervical back: Neck supple.  Lymphadenopathy:     Lower Body: No right inguinal adenopathy. No left inguinal adenopathy.  Neurological:     General: No focal deficit present.     Mental Status: She is alert.     Recent Results (from the past 2160 hour(s))  POCT urinalysis dipstick     Status: Abnormal   Collection Time: 08/19/20 12:42 PM  Result Value Ref Range   Color, UA yellow yellow   Clarity, UA clear clear   Glucose, UA negative negative mg/dL   Bilirubin, UA negative negative   Ketones, POC UA negative negative mg/dL   Spec Grav, UA 3.419 6.222 -  1.025   Blood, UA small (A) negative   pH, UA 7.5 5.0 - 8.0   Protein Ur, POC negative negative mg/dL   Urobilinogen, UA 1.0 0.2 or 1.0 E.U./dL   Nitrite, UA Negative Negative   Leukocytes, UA Negative Negative  POCT urine pregnancy     Status: None   Collection Time: 08/19/20 12:42 PM  Result Value Ref Range   Preg Test, Ur Negative Negative  Cervicovaginal ancillary only     Status: Abnormal   Collection Time: 08/19/20  1:03 PM  Result Value Ref Range   Neisseria Gonorrhea Negative    Chlamydia Negative    Trichomonas Negative    Bacterial Vaginitis (gardnerella) Positive (A)    Comment      Normal Reference Range Bacterial Vaginosis - Negative   Comment Normal Reference Ranger Chlamydia - Negative    Comment      Normal Reference Range Neisseria Gonorrhea - Negative   Comment Normal Reference Range Trichomonas - Negative     Assessment/Plan: 1. Hidradenitis suppurativa No active infection or inflammation at present.  Scarring and postinflammatory hyperpigmentation noted.  Discussed weekly use of Hibiclens.  Can stop Cleocin for now.  Referral to dermatology for further evaluation and management discussed.  Referral placed. - Ambulatory referral to Dermatology  2. Acute vaginitis Giving patient history and examination findings, most consistent with bacterial vaginitis but needs further assessment.  Abdomen swab sent to check for Gardnerella, Candida, trichomoniasis, gonorrhea and chlamydia.  No cervical irritation or cervical motion tenderness noted on examination.  She is to avoid any sexual activity until results are in.  Discussed with her even though she declines other STI testing today, if her results come back positive for an STI she will need to come in for blood work as well.  Patient voiced understanding and agreement with plan. - Cervicovaginal ancillary only  This visit occurred during the SARS-CoV-2 public health emergency.  Safety protocols were in place, including  screening questions prior to the visit, additional usage of staff PPE, and extensive cleaning of exam room while observing appropriate contact time as indicated for disinfecting solutions.     Piedad Climes, PA-C

## 2020-11-13 LAB — CERVICOVAGINAL ANCILLARY ONLY
Bacterial Vaginitis (gardnerella): POSITIVE — AB
Candida Glabrata: NEGATIVE
Candida Vaginitis: POSITIVE — AB
Chlamydia: NEGATIVE
Comment: NEGATIVE
Comment: NEGATIVE
Comment: NEGATIVE
Comment: NEGATIVE
Comment: NEGATIVE
Comment: NORMAL
Neisseria Gonorrhea: NEGATIVE
Trichomonas: NEGATIVE

## 2020-11-14 ENCOUNTER — Other Ambulatory Visit: Payer: Self-pay

## 2020-11-14 MED ORDER — FLUCONAZOLE 150 MG PO TABS: 150.0000 mg | ORAL_TABLET | Freq: Once | ORAL | 0 refills | Status: AC

## 2021-01-21 ENCOUNTER — Encounter: Payer: Self-pay | Admitting: *Deleted

## 2021-01-21 ENCOUNTER — Other Ambulatory Visit: Payer: Self-pay

## 2021-01-21 ENCOUNTER — Ambulatory Visit
Admission: EM | Admit: 2021-01-21 | Discharge: 2021-01-21 | Disposition: A | Discharge status: Home / Self Care | Payer: Medicaid Other | Attending: Emergency Medicine | Admitting: Emergency Medicine

## 2021-01-21 DIAGNOSIS — N898 Other specified noninflammatory disorders of vagina: Secondary | ICD-10-CM | POA: Diagnosis not present

## 2021-01-21 LAB — POCT URINE PREGNANCY: Preg Test, Ur: NEGATIVE

## 2021-01-21 MED ORDER — METRONIDAZOLE 0.75 % VA GEL: 1.0000 | Freq: Every day | VAGINAL | 0 refills | Status: AC

## 2021-01-21 NOTE — ED Triage Notes (Signed)
Pt reports possible STD with vag discharge.

## 2021-01-21 NOTE — Discharge Instructions (Signed)
May begin metrogel daily at bedtime x 5 days  We are testing you for Gonorrhea, Chlamydia, Trichomonas, Yeast and Bacterial Vaginosis. We will call you if anything is positive and let you know if you require any further treatment. Please inform partners of any positive results.   Please return if symptoms not improving with treatment, development of fever, nausea, vomiting, abdominal pain.

## 2021-01-22 LAB — CERVICOVAGINAL ANCILLARY ONLY
Bacterial Vaginitis (gardnerella): POSITIVE — AB
Candida Glabrata: NEGATIVE
Candida Vaginitis: NEGATIVE
Chlamydia: NEGATIVE
Comment: NEGATIVE
Comment: NEGATIVE
Comment: NEGATIVE
Comment: NEGATIVE
Comment: NEGATIVE
Comment: NORMAL
Neisseria Gonorrhea: NEGATIVE
Trichomonas: NEGATIVE
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
## 2021-06-21 ENCOUNTER — Ambulatory Visit
Admission: EM | Admit: 2021-06-21 | Discharge: 2021-06-21 | Disposition: A | Discharge status: Home / Self Care | Payer: Medicaid Other | Attending: Physician Assistant | Admitting: Physician Assistant

## 2021-06-21 ENCOUNTER — Other Ambulatory Visit: Payer: Self-pay

## 2021-06-21 DIAGNOSIS — N898 Other specified noninflammatory disorders of vagina: Secondary | ICD-10-CM

## 2021-06-21 DIAGNOSIS — Z113 Encounter for screening for infections with a predominantly sexual mode of transmission: Secondary | ICD-10-CM | POA: Diagnosis not present

## 2021-06-21 LAB — POCT URINE PREGNANCY: Preg Test, Ur: NEGATIVE

## 2021-06-21 MED ORDER — CLINDAMYCIN PHOSPHATE 2 % VA CREA: 1.0000 | TOPICAL_CREAM | Freq: Every day | VAGINAL | 0 refills | Status: DC
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
## 2021-06-21 NOTE — Discharge Instructions (Addendum)
Use clindamycin vaginal cream at bedtime nightly for 5 to 7 days.  We will contact you with your swab results if we need to change any additional treatment.  Make sure to use condoms.  Use hypoallergenic soaps and detergents.  Wear loosefitting cotton underwear.  If you have any persistent or worsening symptoms please return for reevaluation.

## 2021-06-21 NOTE — ED Triage Notes (Signed)
Pt requesting STD testing, denies vaginal discharge, states has a mild odor.

## 2021-06-24 ENCOUNTER — Telehealth (HOSPITAL_COMMUNITY): Payer: Self-pay | Admitting: Emergency Medicine

## 2021-06-24 LAB — CERVICOVAGINAL ANCILLARY ONLY
Bacterial Vaginitis (gardnerella): POSITIVE — AB
Candida Glabrata: NEGATIVE
Candida Vaginitis: POSITIVE — AB
Chlamydia: NEGATIVE
Comment: NEGATIVE
Comment: NEGATIVE
Comment: NEGATIVE
Comment: NEGATIVE
Comment: NEGATIVE
Comment: NORMAL
Neisseria Gonorrhea: NEGATIVE
Trichomonas: NEGATIVE

## 2021-06-24 MED ORDER — FLUCONAZOLE 150 MG PO TABS: 150.0000 mg | ORAL_TABLET | Freq: Once | ORAL | 0 refills | Status: AC

## 2021-06-24 MED ORDER — METRONIDAZOLE 0.75 % VA GEL: 1.0000 | Freq: Every day | VAGINAL | 0 refills | Status: AC

## 2021-12-11 DIAGNOSIS — N760 Acute vaginitis: Secondary | ICD-10-CM | POA: Diagnosis not present

## 2021-12-11 DIAGNOSIS — Z113 Encounter for screening for infections with a predominantly sexual mode of transmission: Secondary | ICD-10-CM | POA: Diagnosis not present

## 2022-02-11 ENCOUNTER — Encounter (HOSPITAL_COMMUNITY): Payer: Self-pay

## 2022-02-11 ENCOUNTER — Emergency Department (HOSPITAL_COMMUNITY)
Admission: EM | Admit: 2022-02-11 | Discharge: 2022-02-11 | Disposition: A | Discharge status: Home / Self Care | Payer: Medicaid Other | Attending: Emergency Medicine | Admitting: Emergency Medicine

## 2022-02-11 ENCOUNTER — Emergency Department (HOSPITAL_COMMUNITY): Payer: Medicaid Other

## 2022-02-11 ENCOUNTER — Other Ambulatory Visit: Payer: Self-pay

## 2022-02-11 DIAGNOSIS — N9489 Other specified conditions associated with female genital organs and menstrual cycle: Secondary | ICD-10-CM | POA: Diagnosis not present

## 2022-02-11 DIAGNOSIS — Z3A00 Weeks of gestation of pregnancy not specified: Secondary | ICD-10-CM | POA: Diagnosis not present

## 2022-02-11 DIAGNOSIS — Z3201 Encounter for pregnancy test, result positive: Secondary | ICD-10-CM | POA: Diagnosis not present

## 2022-02-11 DIAGNOSIS — N83201 Unspecified ovarian cyst, right side: Secondary | ICD-10-CM | POA: Diagnosis not present

## 2022-02-11 DIAGNOSIS — O209 Hemorrhage in early pregnancy, unspecified: Secondary | ICD-10-CM | POA: Diagnosis not present

## 2022-02-11 DIAGNOSIS — O034 Incomplete spontaneous abortion without complication: Secondary | ICD-10-CM | POA: Diagnosis not present

## 2022-02-11 DIAGNOSIS — Z3A01 Less than 8 weeks gestation of pregnancy: Secondary | ICD-10-CM | POA: Diagnosis not present

## 2022-02-11 LAB — BASIC METABOLIC PANEL
Anion gap: 9 (ref 5–15)
BUN: 7 mg/dL (ref 6–20)
CO2: 21 mmol/L — ABNORMAL LOW (ref 22–32)
Calcium: 9.3 mg/dL (ref 8.9–10.3)
Chloride: 103 mmol/L (ref 98–111)
Creatinine, Ser: 0.65 mg/dL (ref 0.44–1.00)
GFR, Estimated: >60 mL/min (ref >60)
Glucose, Bld: 102 mg/dL — ABNORMAL HIGH (ref 70–99)
Potassium: 3.6 mmol/L (ref 3.5–5.1)
Sodium: 133 mmol/L — ABNORMAL LOW (ref 135–145)

## 2022-02-11 LAB — CBC WITH DIFFERENTIAL/PLATELET
Abs Immature Granulocytes: 0.03 10*3/uL (ref 0.00–0.07)
Basophils Absolute: 0 10*3/uL (ref 0.0–0.1)
Basophils Relative: 0 %
Eosinophils Absolute: 0.2 10*3/uL (ref 0.0–0.5)
Eosinophils Relative: 2 %
HCT: 43.4 % (ref 36.0–46.0)
Hemoglobin: 14.6 g/dL (ref 12.0–15.0)
Immature Granulocytes: 0 %
Lymphocytes Relative: 45 %
Lymphs Abs: 5.3 10*3/uL — ABNORMAL HIGH (ref 0.7–4.0)
MCH: 29.7 pg (ref 26.0–34.0)
MCHC: 33.6 g/dL (ref 30.0–36.0)
MCV: 88.4 fL (ref 80.0–100.0)
Monocytes Absolute: 1.1 10*3/uL — ABNORMAL HIGH (ref 0.1–1.0)
Monocytes Relative: 9 %
Neutro Abs: 5.2 10*3/uL (ref 1.7–7.7)
Neutrophils Relative %: 44 %
Platelets: 356 10*3/uL (ref 150–400)
RBC: 4.91 MIL/uL (ref 3.87–5.11)
RDW: 13.6 % (ref 11.5–15.5)
WBC: 11.7 10*3/uL — ABNORMAL HIGH (ref 4.0–10.5)
nRBC: 0 % (ref 0.0–0.2)

## 2022-02-11 LAB — I-STAT BETA HCG BLOOD, ED (MC, WL, AP ONLY): I-stat hCG, quantitative: >2000 m[IU]/mL — ABNORMAL HIGH (ref <5)

## 2022-02-11 MED ORDER — ACETAMINOPHEN 500 MG PO TABS
1000.0000 mg | ORAL_TABLET | Freq: Once | ORAL | Status: AC
  Administered 2022-02-11: 1000 mg via ORAL
  Filled 2022-02-11: qty 2

## 2022-02-11 MED ORDER — OXYCODONE HCL 5 MG PO TABS
5.0000 mg | ORAL_TABLET | Freq: Once | ORAL | Status: AC
  Administered 2022-02-11: 5 mg via ORAL
  Filled 2022-02-11: qty 1

## 2022-02-11 NOTE — ED Provider Notes (Signed)
?[facility] ?Provider Note ? ? ?CSN: [id number] ?Arrival date & time: 02/11/22  1659 ? ?  ? ?History ? ?Chief Complaint  ?Patient presents with  ? Threatened Miscarriage  ? ? ?Katie Brown is a 31 y.o. female. ? ?31 yo F with a chief complaints of pelvic cramping and vaginal bleeding.  The patient was about [redacted] weeks pregnant and she had gone to a clinic and got a pill to induce an abortion.  It was a two-step process and she had had a change of heart after she took the initial medication and held off on taking the second step. she then researched online and contacted a different provider to try and get information on how long it may be safe for pregnancy.  They suggested that she start on progesterone supplementation.  Has been taking it a couple times a day and then last night developed some pelvic pain and started having some cramping and heavier vaginal bleeding today.  She stated that she did have an ultrasound prior to this and demonstrated an IUP. ? ? ? ?  ? ?Home Medications ?Prior to Admission medications   ?Medication Sig Start Date End Date Taking? Authorizing Provider  ?clindamycin (CLEOCIN) 2 % vaginal cream Place 1 Applicatorful vaginally at bedtime. 06/21/21   Raspet, Derry Skill, PA-C  ?   ? ?Allergies    ?Patient has no known allergies.   ? ?Review of Systems   ?Review of Systems ? ?Physical Exam ?Updated Vital Signs ?BP 127/78   Pulse 82   Temp 98.7 ?F (37.1 ?C) (Oral)   Resp 20   Ht 5\' 6"  (1.676 m)   Wt 69.4 kg   LMP 12/22/2021 (Exact Date)   SpO2 100%   BMI 24.69 kg/m?  ?Physical Exam ?Vitals and nursing note reviewed.  ?Constitutional:   ?   General: She is not in acute distress. ?   Appearance: She is well-developed. She is not diaphoretic.  ?HENT:  ?   Head: Normocephalic and atraumatic.  ?Eyes:  ?   Pupils: Pupils are equal, round, and reactive to light.  ?Cardiovascular:  ?   Rate and Rhythm: Normal rate and regular rhythm.  ?   Heart sounds: No murmur  heard. ?  No friction rub. No gallop.  ?Pulmonary:  ?   Effort: Pulmonary effort is normal.  ?   Breath sounds: No wheezing or rales.  ?Abdominal:  ?   General: There is no distension.  ?   Palpations: Abdomen is soft.  ?   Tenderness: There is no abdominal tenderness.  ?Musculoskeletal:     ?   General: No tenderness.  ?   Cervical back: Normal range of motion and neck supple.  ?Skin: ?   General: Skin is warm and dry.  ?Neurological:  ?   Mental Status: She is alert and oriented to person, place, and time.  ?Psychiatric:     ?   Behavior: Behavior normal.  ? ? ?ED Results / Procedures / Treatments   ?Labs ?(all labs ordered are listed, but only abnormal results are displayed) ?Labs Reviewed  ?CBC WITH DIFFERENTIAL/PLATELET - Abnormal; Notable for the following components:  ?    Result Value  ? WBC 11.7 (*)   ? Lymphs Abs 5.3 (*)   ? Monocytes Absolute 1.1 (*)   ? All other components within normal limits  ?BASIC METABOLIC PANEL - Abnormal; Notable for the following components:  ? Sodium 133 (*)   ? CO2  21 (*)   ? Glucose, Bld 102 (*)   ? All other components within normal limits  ?I-STAT BETA HCG BLOOD, ED ([facility], [facility], [facility] ONLY) - Abnormal; Notable for the following components:  ? I-stat hCG, quantitative >2,000.0 (*)   ? All other components within normal limits  ? ? ?EKG ?None ? ?Radiology ?US OB LESS THAN 14 WEEKS WITH OB TRANSVAGINAL ? ?Result Date: 02/11/2022 ?CLINICAL DATA:  Post abortion pill. Bleeding. Beta HCG greater than 2000. EXAM: OBSTETRIC <14 WK [place] AND TRANSVAGINAL OB US TECHNIQUE: Both transabdominal and transvaginal ultrasound examinations were performed for complete evaluation of the gestation as well as the maternal uterus, adnexal regions, and pelvic cul-de-sac. Transvaginal technique was performed to assess early pregnancy. COMPARISON:  None. FINDINGS: Intrauterine gestational sac: None Endometrium: Heterogeneous and thickened measuring 2 cm in thickness. There is minimal vascular flow seen within  the endometrium. Right ovary: Enlarged measuring 5.7 x 3.0 x 4.2 cm. There is an anechoic cyst with septations measuring 3.5 x 2.5 x 3.1 cm. There is a hypoechoic heterogeneous structure without internal vascular flow measuring 2.5 x 2.2 x 2.7 cm. Left ovary: Within normal limits measuring 3.8 by 1.6 x 2.2 cm. Free fluid: Small. IMPRESSION: 1. No intrauterine gestational sac identified. Thickened heterogeneous endometrium containing minimal vascularity. In the setting of a positive pregnancy test findings are concerning for abortion in progress with retained products of conception. Occult ectopic pregnancy and early normal IUP are also in the differential, but not likely. Recommend correlation with serial beta hCG and short-term follow-up ultrasound. 2. 3.5 cm mildly complex septated right ovarian cyst. 3. 2.7 cm heterogeneous area in the right ovary may represent corpus luteum or hemorrhagic cyst. Electronically Signed   By: Ronney Asters M.D.   On: 02/11/2022 21:47   ? ?Procedures ?Procedures  ? ? ?Medications Ordered in ED ?Medications  ?acetaminophen (TYLENOL) tablet 1,000 mg (1,000 mg Oral Given 02/11/22 2018)  ?oxyCODONE (Oxy IR/ROXICODONE) immediate release tablet 5 mg (5 mg Oral Given 02/11/22 2019)  ? ? ?ED Course/ Medical Decision Making/ A&P ?  ?                        ?Medical Decision Making ?Amount and/or Complexity of Data Reviewed ?Radiology: ordered. ? ?Risk ?OTC drugs. ?Prescription drug management. ? ? ?31 yo F with a chief complaints of pelvic cramping and vaginal bleeding.  It is after the patient had taken a pill to induce abortion.  Likely this is the patient having a abortion currently.  We will obtain a pelvic ultrasound.  No anemia.  Pregnancy test positive.  Mild leukocytosis.  She denied any instrumentation. ? ?Patient without anemia, no significant electrolyte abnormality.  Pelvic ultrasound with likely evolving abortion.  Patient feeling better on reassessment.  We will have her follow-up  with her primary OB. ? ?10:45 PM:  I have discussed the diagnosis/risks/treatment options with the patient.  Evaluation and diagnostic testing in the emergency department does not suggest an emergent condition requiring admission or immediate intervention beyond what has been performed at this time.  They will follow up with  OB. We also discussed returning to the ED immediately if new or worsening sx occur. We discussed the sx which are most concerning (e.g., sudden worsening pain, fever, inability to tolerate by mouth) that necessitate immediate return. Medications administered to the patient during their visit and any new prescriptions provided to the patient are listed below. ? ?Medications given during this  visit ?Medications  ?acetaminophen (TYLENOL) tablet 1,000 mg (1,000 mg Oral Given 02/11/22 2018)  ?oxyCODONE (Oxy IR/ROXICODONE) immediate release tablet 5 mg (5 mg Oral Given 02/11/22 2019)  ? ? ? ?The patient appears reasonably screen and/or stabilized for discharge and I doubt any other medical condition or other [facility] requiring further screening, evaluation, or treatment in the ED at this time prior to discharge.  ? ? ? ? ? ? ? ? ?Final Clinical Impression(s) / ED Diagnoses ?Final diagnoses:  ?Incomplete miscarriage  ? ? ?Rx / DC Orders ?ED Discharge Orders   ? ? None  ? ?  ? ? ?  ?Deno Etienne, DO ?02/11/22 2245 ? ?

## 2022-02-11 NOTE — ED Provider Triage Note (Signed)
Emergency Medicine Provider Triage Evaluation Note ? ?Katie Brown , a 31 y.o. female  was evaluated in triage.  Pt complains of heavy vaginal bleeding.  The patient was [redacted] weeks pregnant and went to an abortion clinic on Saturday. She took the first pill at the clinic. Once she returned home she changed her mind. She discussed her case with a doctor who prescribed progesterone BID. The patient began having heavy bleeding and cramps today.  ? ?Review of Systems  ?Positive: Vaginal bleeding, cramps ?Negative: Shortness of breath ? ?Physical Exam  ?BP 115/77 (BP Location: Left Arm)   Pulse 78   Temp 98.6 ?F (37 ?C) (Oral)   Resp 14   Ht 5\' 6"  (1.676 m)   Wt 69.4 kg   LMP 12/22/2021 (Exact Date)   SpO2 100%   BMI 24.69 kg/m?  ?Gen:   Awake, no distress   ?Resp:  Normal effort  ?MSK:   Moves extremities without difficulty  ?Other:   ? ?Medical Decision Making  ?Medically screening exam initiated at 6:31 PM.  Appropriate orders placed.  Rennee Lienhart was informed that the remainder of the evaluation will be completed by another provider, this initial triage assessment does not replace that evaluation, and the importance of remaining in the ED until their evaluation is complete. ? ? ?  ?Dorothyann Peng, PA-C ?02/11/22 1833 ? ?

## 2022-02-11 NOTE — ED Triage Notes (Signed)
Patient states she took an abortion pill 3 days ago and took a Progesterone  pills x 2 2 days ago and taking 2 daily since. ?Patient states she has been having heavy bleeding today with abdominal  cramping. ?

## 2022-02-11 NOTE — Discharge Instructions (Signed)
Take 4 over the counter ibuprofen tablets 3 times a day or 2 over-the-counter naproxen tablets twice a day for pain. ?Also take tylenol 1000mg (2 extra strength) four times a day.  ? ?Follow with your OBGYN.  Return for worsening pain, fever.   ? ?
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
## 2022-02-11 NOTE — ED Notes (Signed)
Upon this nurse's arrival to pt's room she was eating a baked potato that was brought from an outside source ?

## 2022-02-12 ENCOUNTER — Telehealth: Payer: Self-pay

## 2022-02-12 NOTE — Telephone Encounter (Signed)
Transition Care Management Follow-up Telephone Call ?Date of discharge and from where: 02/11/2022-[facility]  ?How have you been since you were released from the hospital? Pt stated she is doing fine. She has been resting.  ?Any questions or concerns? No ? ?Items Reviewed: ?Did the pt receive and understand the discharge instructions provided? Yes  ?Medications obtained and verified? Yes  ?Other? No  ?Any new allergies since your discharge? No  ?Dietary orders reviewed? No ?Do you have support at home? Yes  ? ?Home Care and Equipment/Supplies: ?Were home health services ordered? not applicable ?If so, what is the name of the agency? N/A  ?Has the agency set up a time to come to the patient's home? not applicable ?Were any new equipment or medical supplies ordered?  No ?What is the name of the medical supply agency? N/A ?Were you able to get the supplies/equipment? not applicable ?Do you have any questions related to the use of the equipment or supplies? No ? ?Functional Questionnaire: (I = Independent and D = Dependent) ?ADLs: I ? ?Bathing/Dressing- I ? ?Meal Prep- I ? ?Eating- I ? ?Maintaining continence- I ? ?Transferring/Ambulation- I ? ?Managing Meds- I ? ?Follow up appointments reviewed: ? ?PCP Hospital f/u appt confirmed? No  confirmed? No   ?Are transportation arrangements needed? No  ?If their condition worsens, is the pt aware to call PCP or go to the Emergency Dept.? Yes ?Was the patient provided with contact information for the PCP's office or ED? Yes ?Was to pt encouraged to call back with questions or concerns? Yes  ?

## 2022-03-06 NOTE — Progress Notes (Deleted)
31 y.o. G1P1001 Single Black or African American Not Hispanic or Latino female here for annual exam.   ?  ? ?No LMP recorded.          ?Sexually active: {yes no:314532}  ?The current method of family planning is {contraception:315051}.    ?Exercising: {yes no:314532}  {types:19826} ?Smoker:  {YES NO:22349} ? ?Health Maintenance: ?Pap:  *** ?History of abnormal Pap:  {YES NO:22349} ?MMG:  N/A ?BMD:   N/A ?Colonoscopy: N/A ?TDaP:  *** ?Gardasil: *** ? ? reports that she has quit smoking. Her smoking use included cigarettes. She has never used smokeless tobacco. She reports that she does not currently use alcohol. She reports that she does not currently use drugs after having used the following drugs: Marijuana. ? ?Past Medical History:  ?Diagnosis Date  ? Medical history non-contributory   ? Scoliosis   ? ? ?Past Surgical History:  ?Procedure Laterality Date  ? NO PAST SURGERIES    ? VAGINAL DELIVERY  2019  ? ? ?Current Outpatient Medications  ?Medication Sig Dispense Refill  ? clindamycin (CLEOCIN) 2 % vaginal cream Place 1 Applicatorful vaginally at bedtime. 40 g 0  ? ?No current facility-administered medications for this visit.  ? ? ?Family History  ?Problem Relation Age of Onset  ? Hypertension Mother   ? Diabetes Maternal Grandmother   ? Diabetes Maternal Grandfather   ? ? ?Review of Systems ? ?Exam:   ?There were no vitals taken for this visit.  Weight change: @WEIGHTCHANGE @ Height:      ?Ht Readings from Last 3 Encounters:  ?02/11/22 5\' 6"  (1.676 m)  ?11/12/20 5\' 6"  (1.676 m)  ?09/18/20 5\' 6"  (1.676 m)  ? ? ?General appearance: alert, cooperative and appears stated age ?Head: Normocephalic, without obvious abnormality, atraumatic ?Neck: no adenopathy, supple, symmetrical, trachea midline and thyroid {CHL AMB PHY EX THYROID NORM DEFAULT:[id number]::"normal to inspection and palpation"} ?Lungs: clear to auscultation bilaterally ?Cardiovascular: regular rate and rhythm ?Breasts: {Exam; breast:13139::"normal  appearance, no masses or tenderness"} ?Abdomen: soft, non-tender; non distended,  no masses,  no organomegaly ?Extremities: extremities normal, atraumatic, no cyanosis or edema ?Skin: Skin color, texture, turgor normal. No rashes or lesions ?Lymph nodes: Cervical, supraclavicular, and axillary nodes normal. ?No abnormal inguinal nodes palpated ?Neurologic: Grossly normal ? ? ?Pelvic: External genitalia:  no lesions ?             Urethra:  normal appearing urethra with no masses, tenderness or lesions ?             Bartholins and Skenes: normal    ?             Vagina: normal appearing vagina with normal color and discharge, no lesions ?             Cervix: {CHL AMB PHY EX CERVIX NORM [phone]::"no lesions"} ?              ?Bimanual Exam:  Uterus:  {CHL AMB PHY EX UTERUS NORM DEFAULT:[phone]::"normal size, contour, position, consistency, mobility, non-tender"} ?             Adnexa: {CHL AMB PHY EX ADNEXA NO MASS [phone]::"no mass, fullness, tenderness"} ?              Rectovaginal: Confirms ?              Anus:  normal sphincter tone, no lesions ? ?*** chaperoned for the exam. ? ?A:  Well Woman with normal exam ? ?P:    ? ?  ?

## 2022-03-07 ENCOUNTER — Encounter: Payer: 59 | Admitting: Radiology

## 2022-03-07 ENCOUNTER — Encounter: Payer: 59 | Admitting: Obstetrics and Gynecology

## 2022-03-07 DIAGNOSIS — Z0289 Encounter for other administrative examinations: Secondary | ICD-10-CM

## 2022-03-28 ENCOUNTER — Institutional Professional Consult (permissible substitution): Payer: 59 | Admitting: Plastic Surgery

## 2022-04-26 ENCOUNTER — Other Ambulatory Visit: Payer: Self-pay | Admitting: Physician Assistant

## 2022-04-26 ENCOUNTER — Ambulatory Visit
Admission: EM | Admit: 2022-04-26 | Discharge: 2022-04-26 | Disposition: A | Discharge status: Home / Self Care | Payer: Medicaid Other | Attending: Internal Medicine | Admitting: Internal Medicine

## 2022-04-26 ENCOUNTER — Encounter: Payer: Self-pay | Admitting: Emergency Medicine

## 2022-04-26 DIAGNOSIS — Z113 Encounter for screening for infections with a predominantly sexual mode of transmission: Secondary | ICD-10-CM | POA: Diagnosis not present

## 2022-04-26 DIAGNOSIS — N926 Irregular menstruation, unspecified: Secondary | ICD-10-CM

## 2022-04-26 DIAGNOSIS — N898 Other specified noninflammatory disorders of vagina: Secondary | ICD-10-CM

## 2022-04-26 LAB — POCT URINE PREGNANCY: Preg Test, Ur: NEGATIVE

## 2022-04-26 NOTE — ED Triage Notes (Signed)
Patient states that she has missed her period for May and having vaginal discharge.  Vaginal discharge white and creamy, fishy odor.
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
## 2022-04-26 NOTE — Discharge Instructions (Signed)
Your urine pregnancy test was negative.  Your blood pregnancy test is pending.  We will call if it is positive.  Your vaginal swab is also pending.  We will call if it is positive and treat as appropriate.  Please refrain from sexual activity until test results and treatment are complete.

## 2022-04-28 ENCOUNTER — Telehealth: Payer: Medicaid Other | Admitting: Physician Assistant

## 2022-04-28 ENCOUNTER — Ambulatory Visit: Payer: Self-pay

## 2022-04-28 ENCOUNTER — Telehealth (HOSPITAL_COMMUNITY): Payer: Self-pay

## 2022-04-28 DIAGNOSIS — B9689 Other specified bacterial agents as the cause of diseases classified elsewhere: Secondary | ICD-10-CM | POA: Diagnosis not present

## 2022-04-28 DIAGNOSIS — N760 Acute vaginitis: Secondary | ICD-10-CM

## 2022-04-28 MED ORDER — METRONIDAZOLE 0.75 % VA GEL: 1.0000 | Freq: Two times a day (BID) | VAGINAL | 0 refills | Status: DC

## 2022-04-28 NOTE — Telephone Encounter (Signed)
  Chief Complaint: BV sx Symptoms: vaginal discomfort, white discharge Frequency: 1 week  Pertinent Negatives: Patient denies odor Disposition: [] ED /[] Urgent Care (no appt availability in office) / [] Appointment(In office/virtual)/ [x]  [facility]/ [] Home Care/ [] Refused Recommended Disposition /[]  Mobile Bus/ []  Follow-up with PCP Additional Notes: pt was requesting rx for symptoms until results come back from UC. I advised pt that she can do virtual UC visit or call UC back and see if they could prescribe something to help with symptoms. Scheduled pt for virtual UC visit at 1800.   Summary: Call back (Symptoms)   Pt called requesting something to relieve her symptoms of BV, has been to the urgent care but is requesting symptom relief while she awaits her results   Best contact: [phone]      Reason for Disposition  [1] Symptoms of a yeast infection (i.e., itchy, white discharge, not bad smelling) AND [2] not improved > 3 days following CARE ADVICE  Answer Assessment - Initial Assessment Questions 1. SYMPTOM: "What's the main symptom you're concerned about?" (e.g., pain, itching, dryness)     Discharge and discomfort 2. LOCATION: "Where is the  sx located?" (e.g., inside/outside, left/right)     vaginal 3. ONSET: "When did the  sx  start?"     1 week 4. PAIN: "Is there any pain?" If Yes, ask: "How bad is it?" (Scale: 1-10; mild, moderate, severe)     mild 6. CAUSE: "What do you think is causing the discharge?" "Have you had the same problem before? What happened then?"     BV 7. OTHER SYMPTOMS: "Do you have any other symptoms?" (e.g., fever, itching, vaginal bleeding, pain with urination, injury to genital area, vaginal foreign body)     White thick discharge  Protocols used: Vaginal Symptoms-A-AH

## 2022-04-28 NOTE — Telephone Encounter (Signed)
Foil punctured on cytology swab. Pt should come back for recollect. Attempted to reach patient. No answer.

## 2022-04-28 NOTE — Patient Instructions (Signed)
Tonna Boehringer, thank you for joining Margaretann Loveless, PA-C for today's virtual visit.  While this provider is not your primary care provider (PCP), if your PCP is located in our provider database this encounter information will be shared with them immediately following your visit.  Consent: (Patient) Katie Brown provided verbal consent for this virtual visit at the beginning of the encounter.  Current Medications:  Current Outpatient Medications:    metroNIDAZOLE (METROGEL VAGINAL) 0.75 % vaginal gel, Place 1 Applicatorful vaginally 2 (two) times daily., Disp: 70 g, Rfl: 0   Medications ordered in this encounter:  Meds ordered this encounter  Medications   metroNIDAZOLE (METROGEL VAGINAL) 0.75 % vaginal gel    Sig: Place 1 Applicatorful vaginally 2 (two) times daily.    Dispense:  70 g    Refill:  0    Order Specific Question:   Supervising Provider    Answer:   Hyacinth Meeker, BRIAN [[id number]]     *If you need refills on other medications prior to your next appointment, please contact your pharmacy*  Follow-Up: Call back or seek an in-person evaluation if the symptoms worsen or if the condition fails to improve as anticipated.  Other Instructions Vaginal Probiotics: AZO vaginal probiotic OLLY Happy Hoo-Ha RAW Vaginal Care RenewLife Women's vaginal probiotic RepHresh Pro-B  Vaginal washes: Honey Pot Summer's Eve Vagisil Feminine cleanser   Bacterial Vaginosis  Bacterial vaginosis is an infection of the vagina. It happens when too many normal germs (healthy bacteria) grow in the vagina. This infection can make it easier to get other infections from sex (STIs). It is very important for pregnant women to get treated. This infection can cause babies to be born early or at a low birth weight. What are the causes? This infection is caused by an increase in certain germs that grow in the vagina. You cannot get this infection from toilet seats, bedsheets, swimming pools, or things  that touch your vagina. What increases the risk? Having sex with a new person or more than one person. Having sex without protection. Douching. Having an intrauterine device (IUD). Smoking. Using drugs or drinking alcohol. These can lead you to do things that are risky. Taking certain antibiotic medicines. Being pregnant. What are the signs or symptoms? Some women have no symptoms. Symptoms may include: A discharge from your vagina. It may be gray or white. It can be watery or foamy. A fishy smell. This can happen after sex or during your menstrual period. Itching in and around your vagina. A feeling of burning or pain when you pee (urinate). How is this treated? This infection is treated with antibiotic medicines. These may be given to you as: A pill. A cream for your vagina. A medicine that you put into your vagina (suppository). If the infection comes back after treatment, you may need more antibiotics. Follow these instructions at home: Medicines Take over-the-counter and prescription medicines as told by your doctor. Take or use your antibiotic medicine as told by your doctor. Do not stop taking or using it, even if you start to feel better. General instructions If the person you have sex with is a woman, tell her that you have this infection. She will need to follow up with her doctor. If you have a female partner, he does not need to be treated. Do not have sex until you finish treatment. Drink enough fluid to keep your pee pale yellow. Keep your vagina and butt clean. Wash the area with warm water each day.  Wipe from front to back after you use the toilet. If you are breastfeeding a baby, ask your doctor if you should keep doing so during treatment. Keep all follow-up visits. How is this prevented? Self-care Do not douche. Use only warm water to wash around your vagina. Wear underwear that is cotton or lined with cotton. Do not wear tight pants and pantyhose, especially  in the summer. Safe sex Use protection when you have sex. This includes: Use condoms. Use dental dams. This is a thin layer that protects the mouth during oral sex. Limit how many people you have sex with. To prevent this infection, it is best to have sex with just one person. Get tested for STIs. The person you have sex with should also get tested. Drugs and alcohol Do not smoke or use any products that contain nicotine or tobacco. If you need help quitting, ask your doctor. Do not use drugs. Do not drink alcohol if: Your doctor tells you not to drink. You are pregnant, may be pregnant, or are planning to become pregnant. If you drink alcohol: Limit how much you have to 0-1 drink a day. Know how much alcohol is in your drink. In the [place] one drink equals one 12 oz bottle of beer (355 mL), one 5 oz glass of wine (148 mL), or one 1 oz glass of hard liquor (44 mL). Where to find more information Centers for Disease Control and Prevention: FootballExhibition.com.br American Sexual Health Association: www.ashastd.org Office on [employer] Health: http://hoffman.com/ Contact a doctor if: Your symptoms do not get better, even after you are treated. You have more discharge or pain when you pee. You have a fever or chills. You have pain in your belly (abdomen) or in the area between your hips. You have pain with sex. You bleed from your vagina between menstrual periods. Summary This infection can happen when too many germs (bacteria) grow in the vagina. This infection can make it easier to get infections from sex (STIs). Treating this can lower that chance. Get treated if you are pregnant. This infection can cause babies to be born early. Do not stop taking or using your antibiotic medicine, even if you start to feel better. This information is not intended to replace advice given to you by your health care provider. Make sure you discuss any questions you have with your health care provider. Document  Revised: 05/10/2020 Document Reviewed: 05/10/2020 Elsevier Patient Education  2023 Elsevier Inc.    If you have been instructed to have an in-person evaluation today at a local Urgent Care facility, please use the link below. It will take you to a list of all of our available [facility], including address, phone number and hours of operation. Please do not delay care.  [facility]  If you or a family member do not have a primary care provider, use the link below to schedule a visit and establish care. When you choose a [facility] primary care physician or advanced practice provider, you gain a long-term partner in health. Find a Primary Care Provider  Learn more about [facility] in-office and virtual care options: [facility] - Get Care Now
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
## 2022-04-29 LAB — SPECIMEN STATUS REPORT

## 2022-04-29 LAB — BETA HCG QUANT (REF LAB): hCG Quant: <1 m[IU]/mL

## 2022-05-08 ENCOUNTER — Ambulatory Visit: Payer: Medicaid Other

## 2022-05-12 ENCOUNTER — Ambulatory Visit
Admission: EM | Admit: 2022-05-12 | Discharge: 2022-05-12 | Disposition: A | Discharge status: Home / Self Care | Payer: Medicaid Other | Attending: Internal Medicine | Admitting: Internal Medicine

## 2022-05-12 DIAGNOSIS — A599 Trichomoniasis, unspecified: Secondary | ICD-10-CM | POA: Insufficient documentation

## 2022-05-12 DIAGNOSIS — B3731 Acute candidiasis of vulva and vagina: Secondary | ICD-10-CM | POA: Insufficient documentation

## 2022-05-12 DIAGNOSIS — N760 Acute vaginitis: Secondary | ICD-10-CM | POA: Insufficient documentation

## 2022-05-12 NOTE — ED Triage Notes (Signed)
Pt presents for cytology recollect from visit X weeks ago.

## 2022-05-13 LAB — CERVICOVAGINAL ANCILLARY ONLY
Bacterial Vaginitis (gardnerella): POSITIVE — AB
Candida Glabrata: NEGATIVE
Candida Vaginitis: POSITIVE — AB
Chlamydia: NEGATIVE
Comment: NEGATIVE
Comment: NEGATIVE
Comment: NEGATIVE
Comment: NEGATIVE
Comment: NEGATIVE
Comment: NORMAL
Neisseria Gonorrhea: NEGATIVE
Trichomonas: POSITIVE — AB

## 2022-05-14 ENCOUNTER — Telehealth (HOSPITAL_COMMUNITY): Payer: Self-pay | Admitting: Emergency Medicine

## 2022-05-14 MED ORDER — METRONIDAZOLE 500 MG PO TABS: 500.0000 mg | ORAL_TABLET | Freq: Two times a day (BID) | ORAL | 0 refills | Status: DC

## 2022-05-14 MED ORDER — FLUCONAZOLE 150 MG PO TABS: 150.0000 mg | ORAL_TABLET | Freq: Once | ORAL | 0 refills | Status: AC

## 2022-10-23 ENCOUNTER — Ambulatory Visit
Admission: RE | Admit: 2022-10-23 | Discharge: 2022-10-23 | Disposition: A | Discharge status: Home / Self Care | Payer: Medicaid Other | Source: Ambulatory Visit | Attending: Physician Assistant | Admitting: Physician Assistant

## 2022-10-23 VITALS — BP 128/83 | HR 80 | Temp 98.4°F | Resp 16

## 2022-10-23 DIAGNOSIS — N898 Other specified noninflammatory disorders of vagina: Secondary | ICD-10-CM | POA: Diagnosis not present

## 2022-10-23 NOTE — ED Triage Notes (Signed)
Pt c/o vaginal odor, discharge, irritation onset ~ 1 month ago.
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
## 2022-10-24 LAB — CERVICOVAGINAL ANCILLARY ONLY
Bacterial Vaginitis (gardnerella): POSITIVE — AB
Candida Glabrata: NEGATIVE
Candida Vaginitis: NEGATIVE
Chlamydia: NEGATIVE
Comment: NEGATIVE
Comment: NEGATIVE
Comment: NEGATIVE
Comment: NEGATIVE
Comment: NEGATIVE
Comment: NORMAL
Neisseria Gonorrhea: NEGATIVE
Trichomonas: NEGATIVE

## 2022-10-27 ENCOUNTER — Telehealth (HOSPITAL_COMMUNITY): Payer: Self-pay | Admitting: Emergency Medicine

## 2022-10-27 DIAGNOSIS — B9689 Other specified bacterial agents as the cause of diseases classified elsewhere: Secondary | ICD-10-CM

## 2022-10-27 DIAGNOSIS — N760 Acute vaginitis: Secondary | ICD-10-CM

## 2022-10-27 MED ORDER — METRONIDAZOLE 0.75 % VA GEL: 1.0000 | Freq: Two times a day (BID) | VAGINAL | 0 refills | Status: DC

## 2022-10-27 MED ORDER — METRONIDAZOLE 500 MG PO TABS: 500.0000 mg | ORAL_TABLET | Freq: Two times a day (BID) | ORAL | 0 refills | Status: DC

## 2022-10-27 NOTE — Telephone Encounter (Signed)
Patient returned call and preferred Metrogel, sent to pharmacy she requested

## 2023-01-27 ENCOUNTER — Telehealth: Payer: Self-pay | Admitting: Physician Assistant

## 2023-01-27 DIAGNOSIS — N760 Acute vaginitis: Secondary | ICD-10-CM

## 2023-01-27 DIAGNOSIS — B9689 Other specified bacterial agents as the cause of diseases classified elsewhere: Secondary | ICD-10-CM

## 2023-01-27 MED ORDER — METRONIDAZOLE 0.75 % EX GEL
CUTANEOUS | 0 refills | Status: DC
Start: 2023-01-27 — End: 2023-05-15
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
## 2023-01-27 NOTE — Patient Instructions (Signed)
Katie Brown, thank you for joining [name], PA-C for today's virtual visit.  While this provider is not your primary care provider (PCP), if your PCP is located in our provider database this encounter information will be shared with them immediately following your visit.   [facility] account gives you access to today's visit and all your visits, tests, and labs performed at [facility] " click here if you don't have a [facility] account or go to [email or web address]  Consent: (Patient) Katie Brown provided verbal consent for this virtual visit at the beginning of the encounter.  Current Medications:  Current Outpatient Medications:    metroNIDAZOLE (METROGEL VAGINAL) 0.75 % vaginal gel, Place 1 Applicatorful vaginally 2 (two) times daily., Disp: 70 g, Rfl: 0   Medications ordered in this encounter:  No orders of the defined types were placed in this encounter.    *If you need refills on other medications prior to your next appointment, please contact your pharmacy*  Follow-Up: Call back or seek an in-person evaluation if the symptoms worsen or if the condition fails to improve as anticipated.  [place] [phone]  Other Instructions  Use the Metrogel as directed. Be mindful of those products used that seem to flare this and avoid them. If not resolving, I want you to be evaluated in person.  See resources below (links) to get in with a new PCP to talk further about ongoing issues with HS.  Try adding a wipe with Hibiclens solution 1-2 x weekly after a shower to see if this helps.   Bacterial Vaginosis  Bacterial vaginosis is an infection of the vagina. It happens when too many normal germs (healthy bacteria) grow in the vagina. This infection can make it easier to get other infections from sex (STIs). It is very important for pregnant women to get treated. This infection can cause babies to be born early or  at a low birth weight. What are the causes? This infection is caused by an increase in certain germs that grow in the vagina. You cannot get this infection from toilet seats, bedsheets, swimming pools, or things that touch your vagina. What increases the risk? Having sex with a new person or more than one person. Having sex without protection. Douching. Having an intrauterine device (IUD). Smoking. Using drugs or drinking alcohol. These can lead you to do things that are risky. Taking certain antibiotic medicines. Being pregnant. What are the signs or symptoms? Some women have no symptoms. Symptoms may include: A discharge from your vagina. It may be gray or white. It can be watery or foamy. A fishy smell. This can happen after sex or during your menstrual period. Itching in and around your vagina. A feeling of burning or pain when you pee (urinate). How is this treated? This infection is treated with antibiotic medicines. These may be given to you as: A pill. A cream for your vagina. A medicine that you put into your vagina (suppository). If the infection comes back after treatment, you may need more antibiotics. Follow these instructions at home: Medicines Take over-the-counter and prescription medicines as told by your doctor. Take or use your antibiotic medicine as told by your doctor. Do not stop taking or using it, even if you start to feel better. General instructions If the person you have sex with is a woman, tell her that you have this infection. She will need to follow up with her doctor. If you have  a female partner, he does not need to be treated. Do not have sex until you finish treatment. Drink enough fluid to keep your pee pale yellow. Keep your vagina and butt clean. Wash the area with warm water each day. Wipe from front to back after you use the toilet. If you are breastfeeding a baby, ask your doctor if you should keep doing so during treatment. Keep all  follow-up visits. How is this prevented? Self-care Do not douche. Use only warm water to wash around your vagina. Wear underwear that is cotton or lined with cotton. Do not wear tight pants and pantyhose, especially in the summer. Safe sex Use protection when you have sex. This includes: Use condoms. Use dental dams. This is a thin layer that protects the mouth during oral sex. Limit how many people you have sex with. To prevent this infection, it is best to have sex with just one person. Get tested for STIs. The person you have sex with should also get tested. Drugs and alcohol Do not smoke or use any products that contain nicotine or tobacco. If you need help quitting, ask your doctor. Do not use drugs. Do not drink alcohol if: Your doctor tells you not to drink. You are pregnant, may be pregnant, or are planning to become pregnant. If you drink alcohol: Limit how much you have to 0-1 drink a day. Know how much alcohol is in your drink. In the [place] one drink equals one 12 oz bottle of beer (355 mL), one 5 oz glass of wine (148 mL), or one 1 oz glass of hard liquor (44 mL). Where to find more information Centers for Disease Control and Prevention: http://www.wolf.info/ American Sexual Health Association: www.ashastd.org Office on [employer] Health: [place] Contact a doctor if: Your symptoms do not get better, even after you are treated. You have more discharge or pain when you pee. You have a fever or chills. You have pain in your belly (abdomen) or in the area between your hips. You have pain with sex. You bleed from your vagina between menstrual periods. Summary This infection can happen when too many germs (bacteria) grow in the vagina. This infection can make it easier to get infections from sex (STIs). Treating this can lower that chance. Get treated if you are pregnant. This infection can cause babies to be born early. Do not stop taking or using your antibiotic  medicine, even if you start to feel better. This information is not intended to replace advice given to you by your health care provider. Make sure you discuss any questions you have with your health care provider. Document Revised: 05/10/2020 Document Reviewed: 05/10/2020 Elsevier Patient Education  [place].    If you have been instructed to have an in-person evaluation today at a local Urgent Care facility, please use the link below. It will take you to a list of all of our available [facility], including address, phone number and hours of operation. Please do not delay care.  [facility]  If you or a family member do not have a primary care provider, use the link below to schedule a visit and establish care. When you choose a [facility] primary care physician or advanced practice provider, you gain a long-term partner in health. Find a Primary Care Provider  Learn more about 's in-office and virtual care options: [facility]

## 2023-05-12 ENCOUNTER — Ambulatory Visit
Admission: EM | Admit: 2023-05-12 | Discharge: 2023-05-12 | Disposition: A | Discharge status: Home / Self Care | Payer: Medicaid Other | Attending: Physician Assistant | Admitting: Physician Assistant

## 2023-05-12 ENCOUNTER — Encounter: Payer: Self-pay | Admitting: Emergency Medicine

## 2023-05-12 ENCOUNTER — Other Ambulatory Visit: Payer: Self-pay

## 2023-05-12 DIAGNOSIS — Z202 Contact with and (suspected) exposure to infections with a predominantly sexual mode of transmission: Secondary | ICD-10-CM | POA: Insufficient documentation

## 2023-05-12 NOTE — ED Triage Notes (Signed)
Pt here for vaginal bleeding after having a period 1 week ago; pt sts she has had unprotected sex
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
## 2023-05-13 LAB — CERVICOVAGINAL ANCILLARY ONLY
Bacterial Vaginitis (gardnerella): POSITIVE — AB
Candida Glabrata: NEGATIVE
Candida Vaginitis: NEGATIVE
Chlamydia: NEGATIVE
Comment: NEGATIVE
Comment: NEGATIVE
Comment: NEGATIVE
Comment: NEGATIVE
Comment: NEGATIVE
Comment: NORMAL
Neisseria Gonorrhea: NEGATIVE
Trichomonas: NEGATIVE

## 2023-05-15 ENCOUNTER — Telehealth (HOSPITAL_COMMUNITY): Payer: Self-pay | Admitting: Emergency Medicine

## 2023-05-15 ENCOUNTER — Telehealth: Payer: Self-pay

## 2023-05-15 ENCOUNTER — Telehealth: Payer: Self-pay | Admitting: Emergency Medicine

## 2023-05-15 DIAGNOSIS — N760 Acute vaginitis: Secondary | ICD-10-CM

## 2023-05-15 MED ORDER — METRONIDAZOLE 0.75 % EX GEL: CUTANEOUS | 0 refills | Status: DC

## 2023-05-15 MED ORDER — METRONIDAZOLE 500 MG PO TABS: 500.0000 mg | ORAL_TABLET | Freq: Two times a day (BID) | ORAL | 0 refills | Status: DC

## 2023-05-15 NOTE — Telephone Encounter (Signed)
  Pt reports she prefers vaginal BV treatment over oral treatment; requests change. RN contact pharmacy and the patient has not picked up rx for oral flagyl. Will rx vaginal tx.

## 2023-08-20 DIAGNOSIS — F439 Reaction to severe stress, unspecified: Secondary | ICD-10-CM | POA: Diagnosis not present

## 2023-09-02 DIAGNOSIS — F411 Generalized anxiety disorder: Secondary | ICD-10-CM | POA: Diagnosis not present

## 2023-09-09 DIAGNOSIS — F411 Generalized anxiety disorder: Secondary | ICD-10-CM | POA: Diagnosis not present

## 2023-09-10 DIAGNOSIS — F411 Generalized anxiety disorder: Secondary | ICD-10-CM | POA: Diagnosis not present

## 2023-09-23 DIAGNOSIS — F411 Generalized anxiety disorder: Secondary | ICD-10-CM | POA: Diagnosis not present

## 2023-09-24 DIAGNOSIS — F411 Generalized anxiety disorder: Secondary | ICD-10-CM | POA: Diagnosis not present

## 2023-09-30 DIAGNOSIS — F411 Generalized anxiety disorder: Secondary | ICD-10-CM | POA: Diagnosis not present

## 2023-10-01 DIAGNOSIS — F411 Generalized anxiety disorder: Secondary | ICD-10-CM | POA: Diagnosis not present

## 2023-10-08 DIAGNOSIS — F411 Generalized anxiety disorder: Secondary | ICD-10-CM | POA: Diagnosis not present

## 2023-10-15 ENCOUNTER — Other Ambulatory Visit (INDEPENDENT_AMBULATORY_CARE_PROVIDER_SITE_OTHER): Payer: Self-pay | Admitting: Emergency Medicine

## 2023-10-15 DIAGNOSIS — F411 Generalized anxiety disorder: Secondary | ICD-10-CM | POA: Diagnosis not present

## 2023-10-15 DIAGNOSIS — N760 Acute vaginitis: Secondary | ICD-10-CM

## 2023-10-27 ENCOUNTER — Telehealth: Payer: Medicaid Other | Admitting: Family Medicine

## 2023-10-27 DIAGNOSIS — N760 Acute vaginitis: Secondary | ICD-10-CM

## 2023-10-27 DIAGNOSIS — B9689 Other specified bacterial agents as the cause of diseases classified elsewhere: Secondary | ICD-10-CM | POA: Diagnosis not present

## 2023-10-27 MED ORDER — METRONIDAZOLE 500 MG PO TABS: 500.0000 mg | ORAL_TABLET | Freq: Two times a day (BID) | ORAL | 0 refills | Status: DC

## 2023-10-27 MED ORDER — METRONIDAZOLE 0.75 % VA GEL: 1.0000 | Freq: Every day | VAGINAL | 0 refills | Status: AC

## 2023-10-27 NOTE — Patient Instructions (Signed)
Tonna Boehringer, thank you for joining Freddy Finner, NP for today's virtual visit.  While this provider is not your primary care provider (PCP), if your PCP is located in our provider database this encounter information will be shared with them immediately following your visit.   A [facility] MyChart account gives you access to today's visit and all your visits, tests, and labs performed at [facility] " click here if you don't have a [facility] MyChart account or go to [email or web address]  Consent: (Patient) Katie Brown provided verbal consent for this virtual visit at the beginning of the encounter.  Current Medications:  Current Outpatient Medications:    metroNIDAZOLE (METROGEL) 0.75 % vaginal gel, Place 1 Applicatorful vaginally at bedtime for 5 days., Disp: 50 g, Rfl: 0   metroNIDAZOLE (METROGEL) 0.75 % gel, Insert one applicatorful (5g) of medicine into the vagina once nightly x 5 days, Disp: 25 g, Rfl: 0   Medications ordered in this encounter:  Meds ordered this encounter  Medications   DISCONTD: metroNIDAZOLE (FLAGYL) 500 MG tablet    Sig: Take 1 tablet (500 mg total) by mouth 2 (two) times daily for 7 days.    Dispense:  14 tablet    Refill:  0    Order Specific Question:   Supervising Provider    Answer:   Merrilee Jansky [[id number]]   metroNIDAZOLE (METROGEL) 0.75 % vaginal gel    Sig: Place 1 Applicatorful vaginally at bedtime for 5 days.    Dispense:  50 g    Refill:  0    Order Specific Question:   Supervising Provider    Answer:   Merrilee Jansky [id number]     *If you need refills on other medications prior to your next appointment, please contact your pharmacy*  Follow-Up: Call back or seek an in-person evaluation if the symptoms worsen or if the condition fails to improve as anticipated.  [facility] [phone]  Other Instructions  Healthy vaginal hygiene practices   -  Avoid sleeper pajamas. Nightgowns allow air  to circulate.  Sleep without underpants whenever possible.  -  Wear cotton underpants during the day. Double-rinse underwear after washing to avoid residual irritants. Do not use fabric softeners for underwear and swimsuits.  - Avoid tights, leotards, leggings, "skinny" jeans, and other tight-fitting clothing. Skirts and loose-fitting pants allow air to circulate.  - Avoid pantyliners.  Instead use tampons or cotton pads.  - Use the restroom after intercourse to help prevent UTI's  - Daily warm bathing is helpful:     - Soak in clean water (no soap) for 10 to 15 minutes. Adding vinegar or baking soda to the water has not been specifically studied and may not be better than clean water alone.      - Use soap to wash regions other than the genital area just before getting out of the tub. Limit use of any soap on genital areas. Use fragance-free soaps.     - Rinse the genital area well and gently pat dry.  Don't rub.  Hair dryer to assist with drying can be used only if on cool setting.     - Do not use bubble baths or perfumed soaps.  - Do not use any feminine sprays, douches or powders.  These contain chemicals that will irritate the skin.  - If the genital area is tender or swollen, cool compresses may relieve the discomfort. Unscented wet wipes can be used instead of  toilet paper for wiping.   - Emollients, such as Vaseline, may help protect skin and can be applied to the irritated area.  - Always remember to wipe front-to-back after bowel movements. Pat dry after urination.  - Do not sit in wet swimsuits for long periods of time after swimming     If you have been instructed to have an in-person evaluation today at a local Urgent Care facility, please use the link below. It will take you to a list of all of our available [facility], including address, phone number and hours of operation. Please do not delay care.  [facility]  If you or a family member do  not have a primary care provider, use the link below to schedule a visit and establish care. When you choose a [facility] primary care physician or advanced practice provider, you gain a long-term partner in health. Find a Primary Care Provider  Learn more about [facility]'s in-office and virtual care options:  - Get Care Now
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
## 2023-10-28 DIAGNOSIS — F411 Generalized anxiety disorder: Secondary | ICD-10-CM | POA: Diagnosis not present

## 2023-11-05 DIAGNOSIS — F411 Generalized anxiety disorder: Secondary | ICD-10-CM | POA: Diagnosis not present

## 2023-11-12 DIAGNOSIS — F411 Generalized anxiety disorder: Secondary | ICD-10-CM | POA: Diagnosis not present

## 2023-11-17 ENCOUNTER — Other Ambulatory Visit: Payer: Self-pay

## 2023-11-17 ENCOUNTER — Encounter: Payer: Self-pay | Admitting: Radiology

## 2023-11-17 ENCOUNTER — Emergency Department
Admission: EM | Admit: 2023-11-17 | Discharge: 2023-11-17 | Disposition: A | Discharge status: Home / Self Care | Payer: Medicaid Other | Attending: Emergency Medicine | Admitting: Emergency Medicine

## 2023-11-17 DIAGNOSIS — H5789 Other specified disorders of eye and adnexa: Secondary | ICD-10-CM | POA: Diagnosis not present

## 2023-11-17 DIAGNOSIS — Z5321 Procedure and treatment not carried out due to patient leaving prior to being seen by health care provider: Secondary | ICD-10-CM | POA: Diagnosis not present

## 2023-11-17 NOTE — ED Triage Notes (Signed)
Pt states her eye started swelling yesterday and states its from her eyelash extensions. She had them removed today.

## 2023-11-18 DIAGNOSIS — L231 Allergic contact dermatitis due to adhesives: Secondary | ICD-10-CM | POA: Diagnosis not present

## 2023-11-23 DIAGNOSIS — F411 Generalized anxiety disorder: Secondary | ICD-10-CM | POA: Diagnosis not present

## 2023-11-26 DIAGNOSIS — F411 Generalized anxiety disorder: Secondary | ICD-10-CM | POA: Diagnosis not present

## 2023-12-02 DIAGNOSIS — F411 Generalized anxiety disorder: Secondary | ICD-10-CM | POA: Diagnosis not present

## 2023-12-03 DIAGNOSIS — F411 Generalized anxiety disorder: Secondary | ICD-10-CM | POA: Diagnosis not present

## 2023-12-09 DIAGNOSIS — F411 Generalized anxiety disorder: Secondary | ICD-10-CM | POA: Diagnosis not present

## 2023-12-09 DIAGNOSIS — Z01419 Encounter for gynecological examination (general) (routine) without abnormal findings: Secondary | ICD-10-CM | POA: Diagnosis not present

## 2023-12-09 DIAGNOSIS — Z114 Encounter for screening for human immunodeficiency virus [HIV]: Secondary | ICD-10-CM | POA: Diagnosis not present

## 2023-12-09 DIAGNOSIS — Z113 Encounter for screening for infections with a predominantly sexual mode of transmission: Secondary | ICD-10-CM | POA: Diagnosis not present

## 2023-12-09 LAB — CYTOLOGY - PAP: Pap: NEGATIVE

## 2024-01-27 DIAGNOSIS — H5213 Myopia, bilateral: Secondary | ICD-10-CM | POA: Diagnosis not present

## 2024-01-27 DIAGNOSIS — F411 Generalized anxiety disorder: Secondary | ICD-10-CM | POA: Diagnosis not present

## 2024-02-03 DIAGNOSIS — F411 Generalized anxiety disorder: Secondary | ICD-10-CM | POA: Diagnosis not present

## 2024-02-04 DIAGNOSIS — F439 Reaction to severe stress, unspecified: Secondary | ICD-10-CM | POA: Diagnosis not present

## 2024-02-18 ENCOUNTER — Emergency Department (HOSPITAL_COMMUNITY)
Admission: EM | Admit: 2024-02-18 | Discharge: 2024-02-18 | Discharge status: Against Medical Advice | Attending: Emergency Medicine | Admitting: Emergency Medicine

## 2024-02-18 ENCOUNTER — Other Ambulatory Visit: Payer: Self-pay

## 2024-02-18 ENCOUNTER — Encounter (HOSPITAL_COMMUNITY): Payer: Self-pay | Admitting: Emergency Medicine

## 2024-02-18 ENCOUNTER — Emergency Department (HOSPITAL_COMMUNITY)

## 2024-02-18 DIAGNOSIS — M79601 Pain in right arm: Secondary | ICD-10-CM | POA: Insufficient documentation

## 2024-02-18 DIAGNOSIS — S199XXA Unspecified injury of neck, initial encounter: Secondary | ICD-10-CM | POA: Diagnosis not present

## 2024-02-18 DIAGNOSIS — M25551 Pain in right hip: Secondary | ICD-10-CM | POA: Diagnosis not present

## 2024-02-18 DIAGNOSIS — Y92410 Unspecified street and highway as the place of occurrence of the external cause: Secondary | ICD-10-CM | POA: Diagnosis not present

## 2024-02-18 DIAGNOSIS — M79604 Pain in right leg: Secondary | ICD-10-CM | POA: Diagnosis not present

## 2024-02-18 DIAGNOSIS — M542 Cervicalgia: Secondary | ICD-10-CM | POA: Diagnosis present

## 2024-02-18 DIAGNOSIS — M25511 Pain in right shoulder: Secondary | ICD-10-CM | POA: Diagnosis not present

## 2024-02-18 DIAGNOSIS — Z5321 Procedure and treatment not carried out due to patient leaving prior to being seen by health care provider: Secondary | ICD-10-CM | POA: Diagnosis not present

## 2024-02-18 LAB — POC URINE PREG, ED: Preg Test, Ur: NEGATIVE

## 2024-02-18 MED ORDER — OXYCODONE-ACETAMINOPHEN 5-325 MG PO TABS
1.0000 | ORAL_TABLET | Freq: Once | ORAL | Status: AC
  Administered 2024-02-18: 1 via ORAL
  Filled 2024-02-18: qty 1
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
## 2024-02-19 ENCOUNTER — Telehealth: Admitting: Physician Assistant

## 2024-02-19 DIAGNOSIS — M542 Cervicalgia: Secondary | ICD-10-CM | POA: Diagnosis not present

## 2024-02-19 DIAGNOSIS — M791 Myalgia, unspecified site: Secondary | ICD-10-CM

## 2024-02-19 MED ORDER — CYCLOBENZAPRINE HCL 10 MG PO TABS: 10.0000 mg | ORAL_TABLET | Freq: Three times a day (TID) | ORAL | 0 refills | Status: DC | PRN

## 2024-02-19 MED ORDER — MELOXICAM 15 MG PO TABS: 15.0000 mg | ORAL_TABLET | Freq: Every day | ORAL | 0 refills | Status: DC

## 2024-02-19 NOTE — Patient Instructions (Signed)
  Tonna Boehringer, thank you for joining Piedad Climes, PA-C for today's virtual visit.  While this provider is not your primary care provider (PCP), if your PCP is located in our provider database this encounter information will be shared with them immediately following your visit.   A [facility] MyChart account gives you access to today's visit and all your visits, tests, and labs performed at [facility] " click here if you don't have a [place] [facility] MyChart account or go to [email or web address]  Consent: (Patient) Katie Brown provided verbal consent for this virtual visit at the beginning of the encounter.  Current Medications:  Current Outpatient Medications:    metroNIDAZOLE (METROGEL) 0.75 % gel, Insert one applicatorful (5g) of medicine into the vagina once nightly x 5 days, Disp: 25 g, Rfl: 0   Medications ordered in this encounter:  No orders of the defined types were placed in this encounter.    *If you need refills on other medications prior to your next appointment, please contact your pharmacy*  Follow-Up: Call back or seek an in-person evaluation if the symptoms worsen or if the condition fails to improve as anticipated.  [facility] [phone]  Other Instructions Please avoid heavy lifting and overexertion. Rest but make sure to get up and walk around a bit every so often to help prevent stiffness. Alternate heat and ice to areas of soreness. Take prescribed mediations as directed. Follow-up in person for any new, worsening or non-resolving symptoms.    If you have been instructed to have an in-person evaluation today at a local Urgent Care facility, please use the link below. It will take you to a list of all of our available [facility], including address, phone number and hours of operation. Please do not delay care.  [facility]  If you or a family member do not have a primary care provider, use the  link below to schedule a visit and establish care. When you choose a [facility] primary care physician or advanced practice provider, you gain a long-term partner in health. Find a Primary Care Provider  Learn more about [facility]'s in-office and virtual care options:  - Get Care Now
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
## 2024-02-24 DIAGNOSIS — F411 Generalized anxiety disorder: Secondary | ICD-10-CM | POA: Diagnosis not present

## 2024-02-25 DIAGNOSIS — F439 Reaction to severe stress, unspecified: Secondary | ICD-10-CM | POA: Diagnosis not present

## 2024-03-03 ENCOUNTER — Other Ambulatory Visit: Payer: Self-pay

## 2024-03-03 ENCOUNTER — Ambulatory Visit
Admission: EM | Admit: 2024-03-03 | Discharge: 2024-03-03 | Disposition: A | Discharge status: Home / Self Care | Attending: Physician Assistant | Admitting: Physician Assistant

## 2024-03-03 DIAGNOSIS — F411 Generalized anxiety disorder: Secondary | ICD-10-CM | POA: Diagnosis not present

## 2024-03-03 DIAGNOSIS — R52 Pain, unspecified: Secondary | ICD-10-CM

## 2024-03-03 DIAGNOSIS — M542 Cervicalgia: Secondary | ICD-10-CM

## 2024-03-03 MED ORDER — KETOROLAC TROMETHAMINE 30 MG/ML IJ SOLN
30.0000 mg | Freq: Once | INTRAMUSCULAR | Status: AC
  Administered 2024-03-03: 30 mg via INTRAMUSCULAR

## 2024-03-03 MED ORDER — METHOCARBAMOL 750 MG PO TABS: 750.0000 mg | ORAL_TABLET | Freq: Three times a day (TID) | ORAL | 0 refills | Status: DC | PRN
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
## 2024-03-05 IMAGING — US US OB < 14 WEEKS - US OB TV
2 series · 14 of 28 positions shown · non-contrast
Comparison: None.

CLINICAL DATA: Post abortion pill. Bleeding. Beta HCG greater than
6111.

EXAM:
OBSTETRIC <14 WK US AND TRANSVAGINAL OB US
TECHNIQUE: Both transabdominal and transvaginal ultrasound examinations were
performed for complete evaluation of the gestation as well as the
maternal uterus, adnexal regions, and pelvic cul-de-sac.
Transvaginal technique was performed to assess early pregnancy.

[Series 1: us ob comp less 14 wks mc & wl · 70 acquisitions, 12 frames shown (1 of 2)]
[im 4/70]
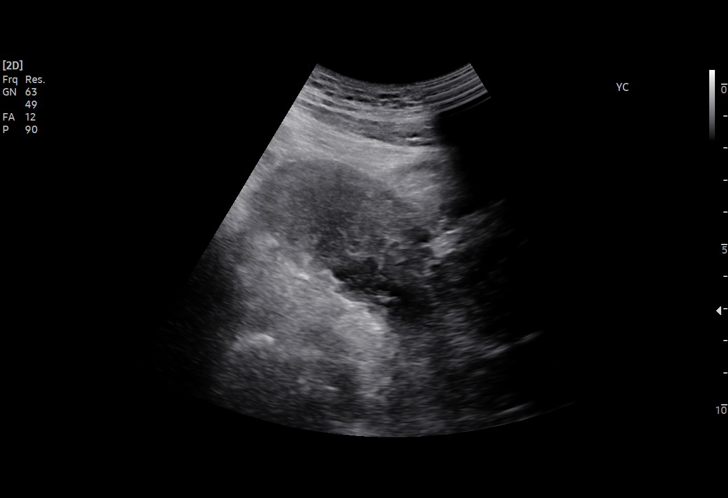
[im 10/70]
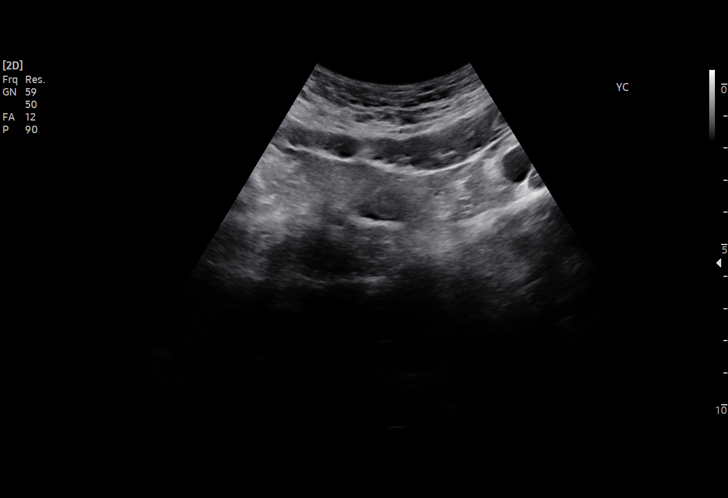
[im 16/70]
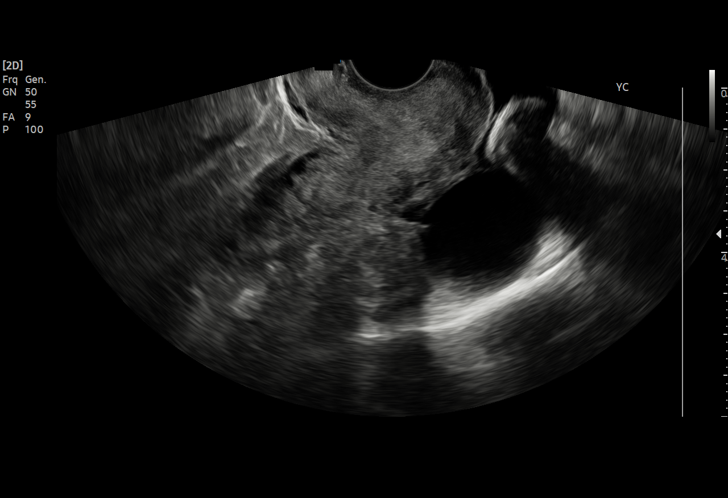
[im 22/70]
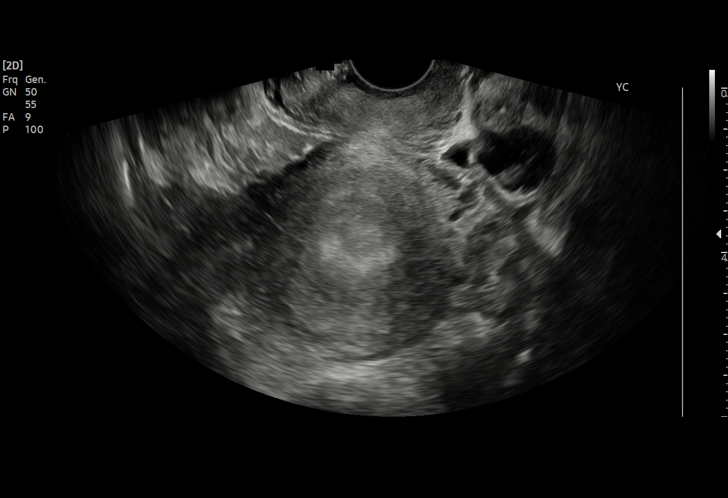
[im 28/70]
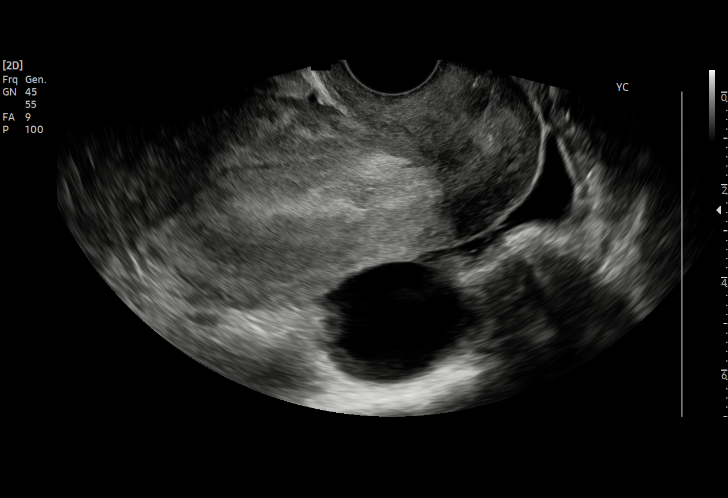
[im 34/70]
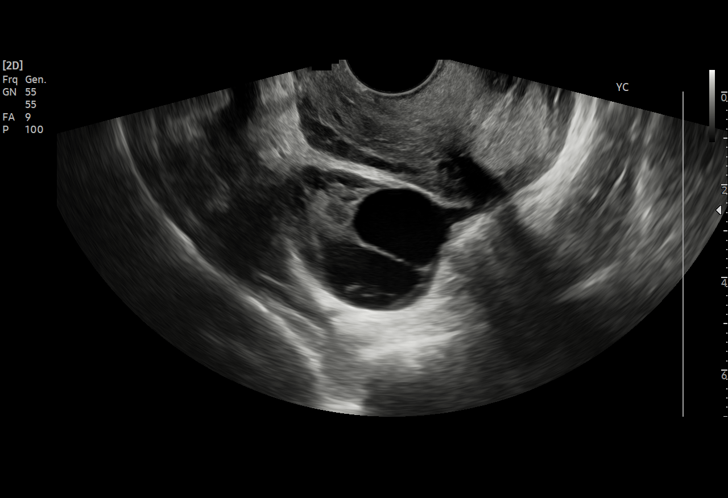
[im 40/70]
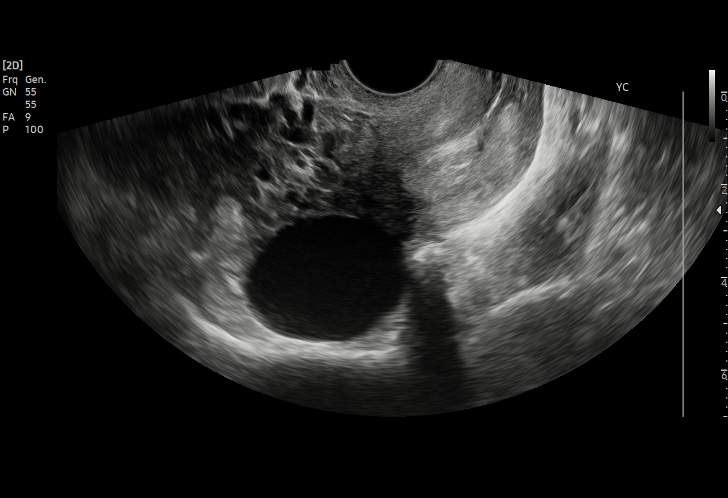
[im 46/70]
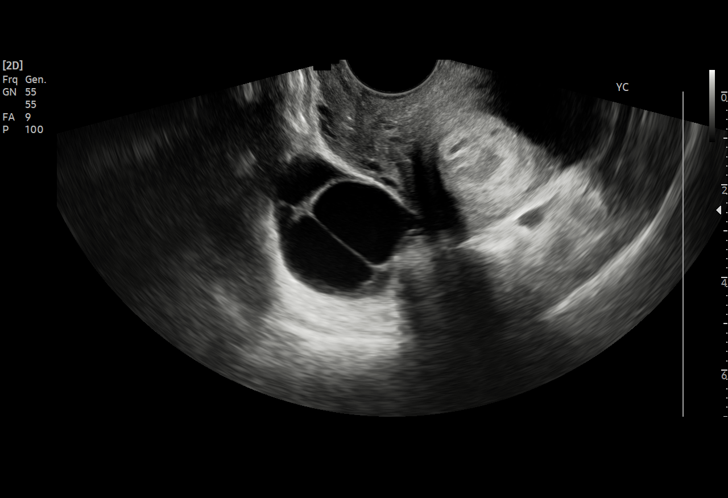
[im 52/70]
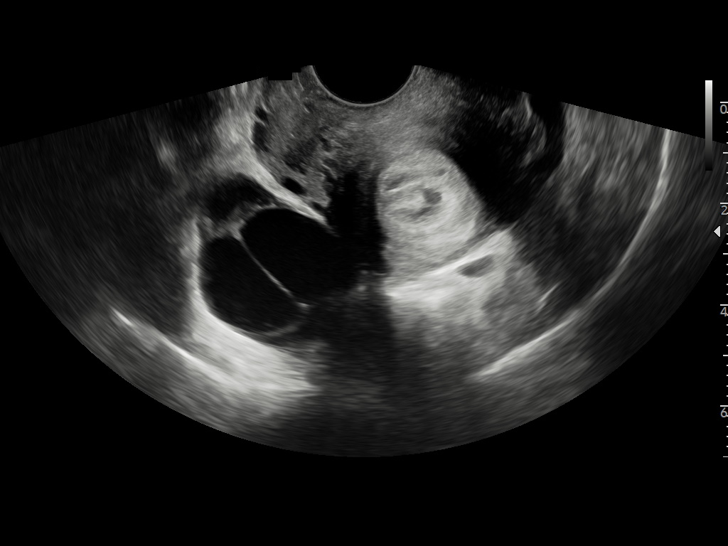
[im 58/70]
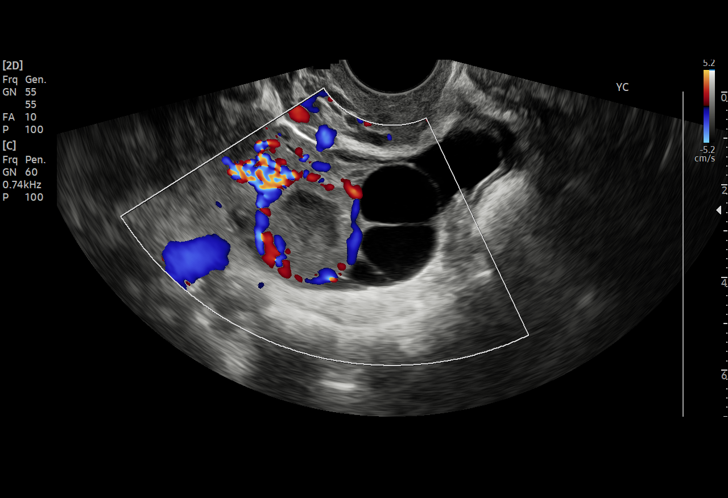
[im 64/70]
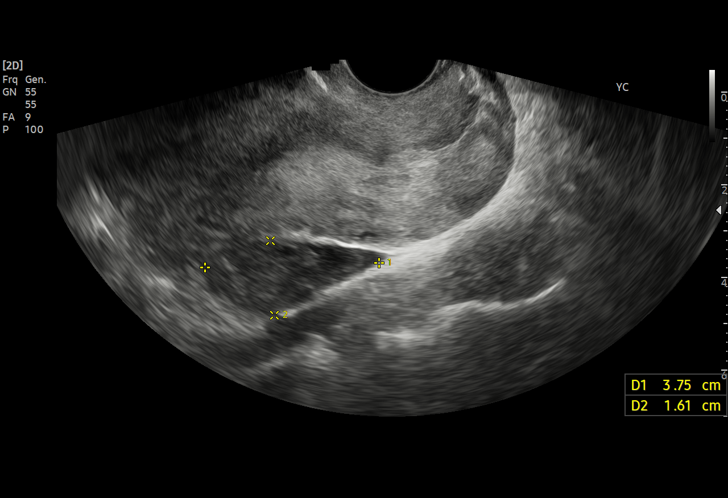
[im 70/70]
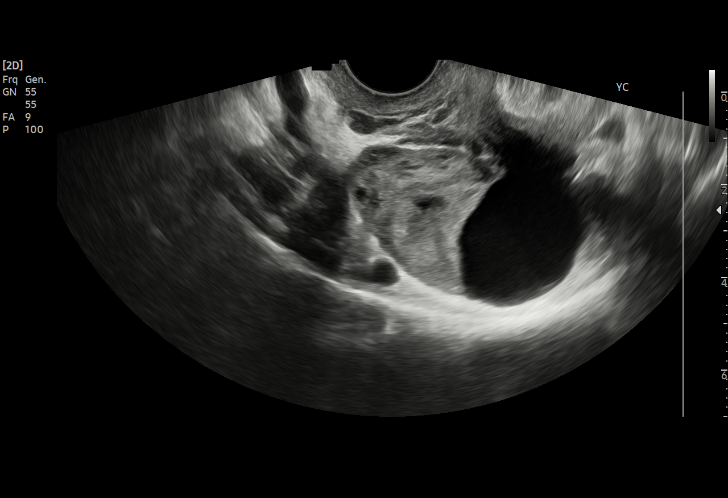

[Series 2: us ob comp less 14 wks mc & wl · 12 acquisitions, 2 frames shown (2 of 2)]
[im 4/12]
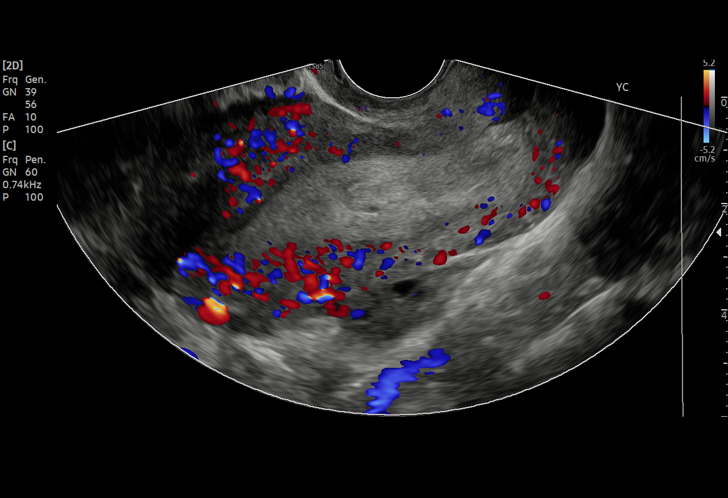
[im 12/12]
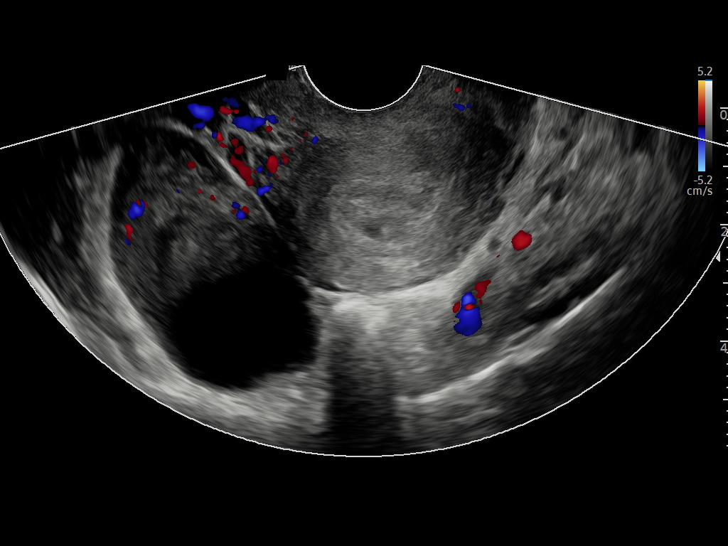

[14 of 28 positions shown; findings below may reference images not displayed]

FINDINGS: Intrauterine gestational sac: None

Endometrium: Heterogeneous and thickened measuring 2 cm in
thickness. There is minimal vascular flow seen within the
endometrium.

Right ovary: Enlarged measuring 5.7 x 3.0 x 4.2 cm. There is an
anechoic cyst with septations measuring 3.5 x 2.5 x 3.1 cm. There is
a hypoechoic heterogeneous structure without internal vascular flow
measuring 2.5 x 2.2 x 2.7 cm.

Left ovary: Within normal limits measuring 3.8 by 1.6 x 2.2 cm.

Free fluid: Small.
IMPRESSION: 1. No intrauterine gestational sac identified. Thickened
heterogeneous endometrium containing minimal vascularity. In the
setting of a positive pregnancy test findings are concerning for
abortion in progress with retained products of conception. Occult
ectopic pregnancy and early normal IUP are also in the differential,
but not likely. Recommend correlation with serial beta hCG and
short-term follow-up ultrasound.
2. 3.5 cm mildly complex septated right ovarian cyst.
3. 2.7 cm heterogeneous area in the right ovary may represent corpus
luteum or hemorrhagic cyst.

## 2024-04-08 ENCOUNTER — Telehealth: Admitting: Nurse Practitioner

## 2024-04-08 ENCOUNTER — Telehealth: Admitting: Family Medicine

## 2024-04-08 DIAGNOSIS — R350 Frequency of micturition: Secondary | ICD-10-CM

## 2024-04-08 DIAGNOSIS — N3000 Acute cystitis without hematuria: Secondary | ICD-10-CM

## 2024-04-08 MED ORDER — CEPHALEXIN 500 MG PO CAPS: 500.0000 mg | ORAL_CAPSULE | Freq: Two times a day (BID) | ORAL | 0 refills | Status: AC
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
## 2024-04-08 NOTE — Patient Instructions (Signed)

## 2024-04-14 DIAGNOSIS — F439 Reaction to severe stress, unspecified: Secondary | ICD-10-CM | POA: Diagnosis not present

## 2024-04-21 DIAGNOSIS — F439 Reaction to severe stress, unspecified: Secondary | ICD-10-CM | POA: Diagnosis not present

## 2024-04-25 DIAGNOSIS — Z113 Encounter for screening for infections with a predominantly sexual mode of transmission: Secondary | ICD-10-CM | POA: Diagnosis not present

## 2024-04-25 DIAGNOSIS — Z114 Encounter for screening for human immunodeficiency virus [HIV]: Secondary | ICD-10-CM | POA: Diagnosis not present

## 2024-04-28 DIAGNOSIS — F439 Reaction to severe stress, unspecified: Secondary | ICD-10-CM | POA: Diagnosis not present

## 2024-05-06 DIAGNOSIS — F411 Generalized anxiety disorder: Secondary | ICD-10-CM | POA: Diagnosis not present

## 2024-05-11 DIAGNOSIS — F411 Generalized anxiety disorder: Secondary | ICD-10-CM | POA: Diagnosis not present

## 2024-05-13 DIAGNOSIS — F411 Generalized anxiety disorder: Secondary | ICD-10-CM | POA: Diagnosis not present

## 2024-08-25 ENCOUNTER — Other Ambulatory Visit: Payer: Self-pay

## 2024-08-25 ENCOUNTER — Ambulatory Visit (INDEPENDENT_AMBULATORY_CARE_PROVIDER_SITE_OTHER): Admitting: *Deleted

## 2024-08-25 VITALS — BP 132/83 | HR 77 | Wt 150.7 lb

## 2024-08-25 DIAGNOSIS — Z3200 Encounter for pregnancy test, result unknown: Secondary | ICD-10-CM

## 2024-08-25 DIAGNOSIS — Z3201 Encounter for pregnancy test, result positive: Secondary | ICD-10-CM | POA: Diagnosis not present

## 2024-08-25 DIAGNOSIS — O3680X0 Pregnancy with inconclusive fetal viability, not applicable or unspecified: Secondary | ICD-10-CM

## 2024-08-25 LAB — POCT PREGNANCY, URINE: Preg Test, Ur: POSITIVE — AB

## 2024-08-25 NOTE — Progress Notes (Signed)
 Possible Pregnancy  Here today for pregnancy confirmation. UPT in office today is positive. Pt reports first positive home UPT two weeks ago. Reviewed dating with patient:   LMP: 07/08/2024 EDD: 04/14/2025 6w 6d today  OB history reviewed. Reviewed medications and allergies with patient; list of medications safe to take during pregnancy given.  Recommended pt begin prenatal vitamin and schedule prenatal care.  Also reviewed location of MAU and MAU precautions.   Rosina, RN

## 2024-08-25 NOTE — Patient Instructions (Signed)

## 2024-09-05 ENCOUNTER — Ambulatory Visit

## 2024-09-05 ENCOUNTER — Other Ambulatory Visit: Payer: Self-pay | Admitting: Physician Assistant

## 2024-09-05 ENCOUNTER — Other Ambulatory Visit: Payer: Self-pay

## 2024-09-05 DIAGNOSIS — O3680X0 Pregnancy with inconclusive fetal viability, not applicable or unspecified: Secondary | ICD-10-CM

## 2024-09-05 DIAGNOSIS — Z3481 Encounter for supervision of other normal pregnancy, first trimester: Secondary | ICD-10-CM | POA: Diagnosis not present

## 2024-09-05 DIAGNOSIS — Z3A00 Weeks of gestation of pregnancy not specified: Secondary | ICD-10-CM

## 2024-09-05 DIAGNOSIS — Z3200 Encounter for pregnancy test, result unknown: Secondary | ICD-10-CM

## 2024-09-13 ENCOUNTER — Telehealth

## 2024-09-13 DIAGNOSIS — Z3A09 9 weeks gestation of pregnancy: Secondary | ICD-10-CM | POA: Diagnosis not present

## 2024-09-13 DIAGNOSIS — Z3491 Encounter for supervision of normal pregnancy, unspecified, first trimester: Secondary | ICD-10-CM

## 2024-09-13 DIAGNOSIS — Z3400 Encounter for supervision of normal first pregnancy, unspecified trimester: Secondary | ICD-10-CM | POA: Insufficient documentation

## 2024-09-13 DIAGNOSIS — Z3401 Encounter for supervision of normal first pregnancy, first trimester: Secondary | ICD-10-CM

## 2024-09-13 DIAGNOSIS — Z3490 Encounter for supervision of normal pregnancy, unspecified, unspecified trimester: Secondary | ICD-10-CM | POA: Insufficient documentation

## 2024-09-13 MED ORDER — GOJJI WEIGHT SCALE MISC: 1.0000 | Freq: Every day | 0 refills | Status: DC

## 2024-09-13 MED ORDER — BLOOD PRESSURE KIT DEVI: 1.0000 | Freq: Every day | 0 refills | Status: DC

## 2024-09-13 MED ORDER — ONDANSETRON HCL 4 MG PO TABS: 4.0000 mg | ORAL_TABLET | Freq: Three times a day (TID) | ORAL | 0 refills | Status: AC | PRN
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
## 2024-09-15 DIAGNOSIS — F4323 Adjustment disorder with mixed anxiety and depressed mood: Secondary | ICD-10-CM | POA: Diagnosis not present

## 2024-09-22 ENCOUNTER — Telehealth: Admitting: Emergency Medicine

## 2024-09-22 ENCOUNTER — Encounter: Payer: Self-pay | Admitting: Family Medicine

## 2024-09-22 DIAGNOSIS — K5900 Constipation, unspecified: Secondary | ICD-10-CM | POA: Diagnosis not present

## 2024-09-22 DIAGNOSIS — F4323 Adjustment disorder with mixed anxiety and depressed mood: Secondary | ICD-10-CM | POA: Diagnosis not present
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
## 2024-09-22 NOTE — Patient Instructions (Addendum)
  Katie Brown, thank you for joining Katie CHRISTELLA Belt, NP for today's virtual visit.  While this provider is not your primary care provider (PCP), if your PCP is located in our provider database this encounter information will be shared with them immediately following your visit.   A [facility] MyChart account gives you access to today's visit and all your visits, tests, and labs performed at [facility]  click here if you don't have a [place] MyChart account or go to [email or web address]  Consent: (Patient) Katie Brown provided verbal consent for this virtual visit at the beginning of the encounter.  Current Medications:  Current Outpatient Medications:    Blood Pressure Monitoring (BLOOD PRESSURE KIT) DEVI, 1 Device by Does not apply route daily., Disp: 1 each, Rfl: 0   Misc. Devices (GOJJI WEIGHT SCALE) MISC, 1 each by Does not apply route daily., Disp: 1 each, Rfl: 0   ondansetron  (ZOFRAN ) 4 MG tablet, Take 1 tablet (4 mg total) by mouth every 8 (eight) hours as needed for nausea or vomiting., Disp: 20 tablet, Rfl: 0   Medications ordered in this encounter:  No orders of the defined types were placed in this encounter.    *If you need refills on other medications prior to your next appointment, please contact your pharmacy*  Follow-Up: Call back or seek an in-person evaluation if the symptoms worsen or if the condition fails to improve as anticipated.  [facility] [phone]  Other Instructions Please talk with your OB office about BV and what to do during pregnancy.   It's normal for most people to have a bowel movement once a day and the poop should be the consistency of mashed potatoes.  Having bowel movements less than once a day or having poop that is hard or hard to pass are signs of constipation.     To treat constipation, increase the amount of water you drink and increase fiber in your diet. Foods like beans, oatmeal, and prunes and  fruit are high in fiber.   Because your constipation has been going on for a long time, I think you would also benefit from a fiber supplement like Metamucil (ok to use generic brands). I recommend going slow with home much Metamucil you take. Start with one dose of Metamucil a day in water for 3 weeks. If your poop isn't mashed potatoes consistency and you still are not pooping every 1-2 days, after 3 weeks, do to 2 doses of Metamucil a day.    If you have been instructed to have an in-person evaluation today at a local Urgent Care facility, please use the link below. It will take you to a list of all of our available [place] [facility], including address, phone number and hours of operation. Please do not delay care.  [place] [facility]  If you or a family member do not have a primary care provider, use the link below to schedule a visit and establish care. When you choose a [place] primary care physician or advanced practice provider, you gain a long-term partner in health. Find a Primary Care Provider  Learn more about [facility] in-office and virtual care options: [place] - Get Care Now

## 2024-09-26 ENCOUNTER — Other Ambulatory Visit: Payer: Self-pay

## 2024-09-26 ENCOUNTER — Encounter: Payer: Self-pay | Admitting: Family Medicine

## 2024-09-26 ENCOUNTER — Other Ambulatory Visit (HOSPITAL_COMMUNITY)
Admission: RE | Admit: 2024-09-26 | Discharge: 2024-09-26 | Disposition: A | Discharge status: Home / Self Care | Source: Ambulatory Visit | Attending: Family Medicine | Admitting: Family Medicine

## 2024-09-26 ENCOUNTER — Ambulatory Visit (INDEPENDENT_AMBULATORY_CARE_PROVIDER_SITE_OTHER): Admitting: Family Medicine

## 2024-09-26 VITALS — BP 116/80 | HR 79 | Wt 147.0 lb

## 2024-09-26 DIAGNOSIS — Z3A11 11 weeks gestation of pregnancy: Secondary | ICD-10-CM

## 2024-09-26 DIAGNOSIS — Z3491 Encounter for supervision of normal pregnancy, unspecified, first trimester: Secondary | ICD-10-CM | POA: Diagnosis not present

## 2024-09-26 DIAGNOSIS — O1495 Unspecified pre-eclampsia, complicating the puerperium: Secondary | ICD-10-CM

## 2024-09-26 MED ORDER — GOJJI WEIGHT SCALE MISC
1.0000 | Freq: Every day | 0 refills | Status: AC
Start: 2024-09-26 — End: ?

## 2024-09-26 MED ORDER — ASPIRIN 81 MG PO TBEC
81.0000 mg | DELAYED_RELEASE_TABLET | Freq: Every day | ORAL | 12 refills | Status: AC
Start: 2024-09-26 — End: ?

## 2024-09-26 MED ORDER — BLOOD PRESSURE KIT DEVI: 1.0000 | Freq: Every day | 0 refills | Status: AC

## 2024-09-26 NOTE — Progress Notes (Signed)
 Subjective:   Katie Brown is a 33 y.o. G3P1011 at [redacted]w[redacted]d by LMP being seen today for her first obstetrical visit.  Her obstetrical history is significant for pre-eclampsia. Patient does intend to breast feed. Pregnancy history fully reviewed.  Patient reports no bleeding, no contractions, no cramping, and no leaking.  HISTORY: OB History  Gravida Para Term Preterm AB Living  3 1 1  0 1 1  SAB IAB Ectopic Multiple Live Births  0 1 0 0 1    # Outcome Date GA Lbr Len/2nd Weight Sex Type Anes PTL Lv  3 Current           2 IAB 2023 [redacted]w[redacted]d         1 Term 06/05/18 [redacted]w[redacted]d 03:58 / 00:34 6 lb 5 oz (2.863 kg) F Vag-Spont EPI  LIV     Name: Katie Brown     Apgar1: 9  Apgar5: 9   Last pap smear was 12/09/2023 and was normal Past Medical History:  Diagnosis Date   Medical history non-contributory    Pre-eclampsia    postpartum   Scoliosis    Past Surgical History:  Procedure Laterality Date   NO PAST SURGERIES     VAGINAL DELIVERY  2019   Family History  Problem Relation Age of Onset   Hypertension Mother    Diabetes Maternal Grandmother    Diabetes Maternal Grandfather    Social History   Tobacco Use   Smoking status: Former    Types: Cigarettes   Smokeless tobacco: Never  Vaping Use   Vaping status: Never Used  Substance Use Topics   Alcohol use: Not Currently   Drug use: Not Currently    Types: Marijuana    Comment: last use 1 month ago   No Known Allergies Current Outpatient Medications on File Prior to Visit  Medication Sig Dispense Refill   ondansetron  (ZOFRAN ) 4 MG tablet Take 1 tablet (4 mg total) by mouth every 8 (eight) hours as needed for nausea or vomiting. 20 tablet 0   Prenatal Vit-Fe Fumarate-FA (MULTIVITAMIN-PRENATAL) 27-0.8 MG TABS tablet Take 1 tablet by mouth daily at 12 noon.     No current facility-administered medications on file prior to visit.     Exam   Vitals:   09/26/24 1549  BP: 116/80  Pulse: 79  Weight: 147 lb (66.7 kg)    Fetal Heart Rate (bpm): 162  System: General: well-developed, well-nourished female in no acute distress   Skin: normal coloration and turgor, no rashes   Neurologic: oriented, normal, negative, normal mood   Extremities: normal strength, tone, and muscle mass, ROM of all joints is normal   HEENT PERRLA, extraocular movement intact and sclera clear, anicteric   Mouth/Teeth mucous membranes moist, pharynx normal without lesions and dental hygiene good   Neck supple and no masses   Cardiovascular: regular rate and rhythm   Respiratory:  no respiratory distress, normal breath sounds   Abdomen: soft, non-tender; bowel sounds normal; no masses,  no organomegaly     Assessment:   Pregnancy: G3P1011 Patient Active Problem List   Diagnosis Date Noted   Supervision of low-risk pregnancy 09/13/2024   Gastroesophageal reflux disease 08/13/2018   Pre-eclampsia in postpartum period 06/15/2018     Plan:  1. Encounter for supervision of low-risk pregnancy in first trimester (Primary) FHR BP appropriate today - Blood Pressure Monitoring (BLOOD PRESSURE KIT) DEVI; 1 Device by Does not apply route daily. (Patient not taking: Reported on 09/26/2024)  Dispense: 1 each; Refill: 0 - Misc. Devices (GOJJI WEIGHT SCALE) MISC; 1 each by Does not apply route daily. Low risk pregnancy icd 10dx: z34.90 (Patient not taking: Reported on 09/26/2024)  Dispense: 1 each; Refill: 0 - Culture, OB Urine - CBC/D/Plt+RPR+Rh+ABO+RubIgG... - GC/Chlamydia probe amp ([place])not at [facility] - Hemoglobin A1c - PANORAMA PRENATAL TEST - [place] Basic Panel  2. History of Pre-eclampsia in postpartum period Patient reports postpartum preeclampsia in previous pregnancy.  Was induced at 38 weeks for abnormal BPP. Will start ASA at 12 weeks  3. [redacted] weeks gestation of pregnancy   Initial labs drawn. Continue prenatal vitamins. Genetic Screening discussed, NIPS: ordered. Ultrasound discussed; fetal anatomic survey:  Previously scheduled for 1/5. Problem list reviewed and updated. The nature of [facility] with multiple MDs and other Advanced Practice Providers was explained to patient; also emphasized that residents, students are part of our team. Routine obstetric precautions reviewed. No follow-ups on file.

## 2024-09-26 NOTE — Addendum Note (Signed)
 Addended by: INOCENTE ELENOR CROME on: 09/26/2024 05:03 PM   Modules accepted: Orders

## 2024-09-26 NOTE — Patient Instructions (Signed)

## 2024-09-27 LAB — CBC/D/PLT+RPR+RH+ABO+RUBIGG...
Antibody Screen: NEGATIVE
Basophils Absolute: 0 x10E3/uL (ref 0.0–0.2)
Basos: 0 %
EOS (ABSOLUTE): 0.1 x10E3/uL (ref 0.0–0.4)
Eos: 2 %
HCV Ab: NONREACTIVE
HIV Screen 4th Generation wRfx: NONREACTIVE
Hematocrit: 39 % (ref 34.0–46.6)
Hemoglobin: 12.8 g/dL (ref 11.1–15.9)
Hepatitis B Surface Ag: NEGATIVE
Immature Grans (Abs): 0 x10E3/uL (ref 0.0–0.1)
Immature Granulocytes: 0 %
Lymphocytes Absolute: 2.6 x10E3/uL (ref 0.7–3.1)
Lymphs: 35 %
MCH: 29.5 pg (ref 26.6–33.0)
MCHC: 32.8 g/dL (ref 31.5–35.7)
MCV: 90 fL (ref 79–97)
Monocytes Absolute: 0.7 x10E3/uL (ref 0.1–0.9)
Monocytes: 9 %
Neutrophils Absolute: 4.1 x10E3/uL (ref 1.4–7.0)
Neutrophils: 54 %
Platelets: 313 x10E3/uL (ref 150–450)
RBC: 4.34 x10E6/uL (ref 3.77–5.28)
RDW: 13 % (ref 11.7–15.4)
RPR Ser Ql: NONREACTIVE
Rh Factor: POSITIVE
Rubella Antibodies, IGG: 10.9 {index} (ref >0.99)
WBC: 7.7 x10E3/uL (ref 3.4–10.8)

## 2024-09-27 LAB — HEMOGLOBIN A1C
Est. average glucose Bld gHb Est-mCnc: 105 mg/dL
Hgb A1c MFr Bld: 5.3 % (ref 4.8–5.6)

## 2024-09-27 LAB — HCV INTERPRETATION

## 2024-09-28 LAB — GC/CHLAMYDIA PROBE AMP (~~LOC~~) NOT AT ARMC
Chlamydia: NEGATIVE
Comment: NEGATIVE
Comment: NORMAL
Neisseria Gonorrhea: NEGATIVE

## 2024-09-28 LAB — URINE CULTURE, OB REFLEX

## 2024-09-28 LAB — CULTURE, OB URINE

## 2024-09-29 ENCOUNTER — Encounter: Payer: Self-pay | Admitting: Family Medicine

## 2024-10-02 LAB — PANORAMA PRENATAL TEST FULL PANEL:PANORAMA TEST PLUS 5 ADDITIONAL MICRODELETIONS: FETAL FRACTION: 8.2

## 2024-10-03 LAB — HORIZON CUSTOM: REPORT SUMMARY: NEGATIVE

## 2024-10-06 DIAGNOSIS — F4323 Adjustment disorder with mixed anxiety and depressed mood: Secondary | ICD-10-CM | POA: Diagnosis not present

## 2024-10-10 ENCOUNTER — Ambulatory Visit: Payer: Self-pay | Admitting: Family Medicine

## 2024-10-10 DIAGNOSIS — Z3491 Encounter for supervision of normal pregnancy, unspecified, first trimester: Secondary | ICD-10-CM

## 2024-10-22 NOTE — Progress Notes (Signed)
   PRENATAL VISIT NOTE  Subjective:  Katie Brown is a 33 y.o. G3P1011 at [redacted]w[redacted]d being seen today for ongoing prenatal care.  She is currently monitored for the following issues for this low-risk pregnancy and has Pre-eclampsia in postpartum period; Gastroesophageal reflux disease; and Supervision of low-risk pregnancy on their problem list.  Patient reports {[id number]}.   .  .   . Denies leaking of fluid.   The following portions of the patient's history were reviewed and updated as appropriate: allergies, current medications, past family history, past medical history, past social history, past surgical history and problem list.   Objective:   There were no vitals filed for this visit.  Fetal Status:           General: Alert, oriented and cooperative. Patient is in no acute distress.  Skin: Skin is warm and dry. No rash noted.   Cardiovascular: Normal heart rate noted  Respiratory: Normal respiratory effort, no problems with respiration noted  Abdomen: Soft, gravid, appropriate for gestational age.        Pelvic: Cervical exam deferred        Extremities: Normal range of motion.     Mental Status: Normal mood and affect. Normal behavior. Normal judgment and thought content.      09/26/2024    3:58 PM 09/18/2020    2:13 PM 04/24/2016   10:50 AM  Depression screen PHQ 2/9  Decreased Interest 0 0 0  Down, Depressed, Hopeless 0 0 0  PHQ - 2 Score 0 0 0  Altered sleeping 0 0   Tired, decreased energy 0 0   Change in appetite 2 0   Feeling bad or failure about yourself  0 0   Trouble concentrating 0 0   Moving slowly or fidgety/restless 0 0   Suicidal thoughts 0 0   PHQ-9 Score 2  0    Difficult doing work/chores  Not difficult at all      Data saved with a previous flowsheet row definition        09/26/2024    3:58 PM  GAD 7 : Generalized Anxiety Score  Nervous, Anxious, on Edge 0  Control/stop worrying 0  Worry too much - different things 0  Trouble relaxing 0  Restless  0  Easily annoyed or irritable 0  Afraid - awful might happen 0  Total GAD 7 Score 0    Assessment and Plan:  Pregnancy: G3P1011 at [redacted]w[redacted]d 1. Encounter for supervision of low-risk pregnancy in first trimester (Primary) ***  2. [redacted] weeks gestation of pregnancy - Routine PNC, anticipatory guidance.   3. History of Pre-eclampsia in postpartum period - On 81mg  ASA daily for prophylaxis.   Preterm labor symptoms and general obstetric precautions including but not limited to vaginal bleeding, contractions, leaking of fluid and fetal movement were reviewed in detail with the patient. Please refer to After Visit Summary for other counseling recommendations.   Return in about 4 weeks (around 11/22/2024) for LOB.  Future Appointments  Date Time Provider Department Center  10/25/2024  1:55 PM Regino Camie DELENA EDDY [facility]  11/28/2024  8:00 AM [facility] PROVIDER 1 [facility]  11/28/2024  8:30 AM [facility] US5 [facility]    Camie DELENA Regino, CNM

## 2024-10-25 ENCOUNTER — Other Ambulatory Visit: Payer: Self-pay

## 2024-10-25 ENCOUNTER — Ambulatory Visit: Admitting: Certified Nurse Midwife

## 2024-10-25 VITALS — BP 112/77 | HR 114 | Wt 145.0 lb

## 2024-10-25 DIAGNOSIS — Z3491 Encounter for supervision of normal pregnancy, unspecified, first trimester: Secondary | ICD-10-CM

## 2024-10-25 DIAGNOSIS — Z3A15 15 weeks gestation of pregnancy: Secondary | ICD-10-CM

## 2024-10-25 DIAGNOSIS — Z3492 Encounter for supervision of normal pregnancy, unspecified, second trimester: Secondary | ICD-10-CM | POA: Diagnosis not present

## 2024-10-25 DIAGNOSIS — O1495 Unspecified pre-eclampsia, complicating the puerperium: Secondary | ICD-10-CM

## 2024-10-25 NOTE — Patient Instructions (Signed)
 Katie Brown

## 2024-10-27 DIAGNOSIS — F4323 Adjustment disorder with mixed anxiety and depressed mood: Secondary | ICD-10-CM | POA: Diagnosis not present

## 2024-11-28 ENCOUNTER — Other Ambulatory Visit: Payer: Self-pay

## 2024-11-28 ENCOUNTER — Other Ambulatory Visit

## 2024-11-28 ENCOUNTER — Ambulatory Visit (INDEPENDENT_AMBULATORY_CARE_PROVIDER_SITE_OTHER): Admitting: Family Medicine

## 2024-11-28 ENCOUNTER — Ambulatory Visit

## 2024-11-28 VITALS — BP 117/84 | HR 78 | Wt 139.5 lb

## 2024-11-28 DIAGNOSIS — Z3A20 20 weeks gestation of pregnancy: Secondary | ICD-10-CM

## 2024-11-28 DIAGNOSIS — O1495 Unspecified pre-eclampsia, complicating the puerperium: Secondary | ICD-10-CM

## 2024-11-28 DIAGNOSIS — Z3491 Encounter for supervision of normal pregnancy, unspecified, first trimester: Secondary | ICD-10-CM

## 2024-11-28 MED ORDER — ONDANSETRON 4 MG PO TBDP: 4.0000 mg | ORAL_TABLET | Freq: Three times a day (TID) | ORAL | 0 refills | Status: AC | PRN

## 2024-11-28 NOTE — Progress Notes (Signed)
" ° °  PRENATAL VISIT NOTE  Subjective:  Katie Brown is a 34 y.o. G3P1011 at [redacted]w[redacted]d being seen today for ongoing prenatal care.  She is currently monitored for the following issues for this low-risk pregnancy and has Pre-eclampsia in postpartum period; Gastroesophageal reflux disease; and Supervision of low-risk pregnancy on their problem list.  Patient reports no bleeding, no contractions, no cramping, and no leaking.  Contractions: Not present. Vag. Bleeding: None.  Movement: Present. Denies leaking of fluid.   The following portions of the patient's history were reviewed and updated as appropriate: allergies, current medications, past family history, past medical history, past social history, past surgical history and problem list.   Objective:   Vitals:   11/28/24 1016  BP: 117/84  Pulse: 78  Weight: 139 lb 8 oz (63.3 kg)    Fetal Status:  Fetal Heart Rate (bpm): 145   Movement: Present    General: Alert, oriented and cooperative. Patient is in no acute distress.  Skin: Skin is warm and dry. No rash noted.   Cardiovascular: Normal heart rate noted  Respiratory: Normal respiratory effort, no problems with respiration noted  Abdomen: Soft, gravid, appropriate for gestational age.  Pain/Pressure: Present     Pelvic: Cervical exam deferred        Extremities: Normal range of motion.  Edema: None  Mental Status: Normal mood and affect. Normal behavior. Normal judgment and thought content.      09/26/2024    3:58 PM 09/18/2020    2:13 PM 04/24/2016   10:50 AM  Depression screen PHQ 2/9  Decreased Interest 0 0 0  Down, Depressed, Hopeless 0 0 0  PHQ - 2 Score 0 0 0  Altered sleeping 0 0   Tired, decreased energy 0 0   Change in appetite 2 0   Feeling bad or failure about yourself  0 0   Trouble concentrating 0 0   Moving slowly or fidgety/restless 0 0   Suicidal thoughts 0 0   PHQ-9 Score 2  0    Difficult doing work/chores  Not difficult at all      Data saved with a previous  flowsheet row definition        09/26/2024    3:58 PM  GAD 7 : Generalized Anxiety Score  Nervous, Anxious, on Edge 0  Control/stop worrying 0  Worry too much - different things 0  Trouble relaxing 0  Restless 0  Easily annoyed or irritable 0  Afraid - awful might happen 0  Total GAD 7 Score 0    Assessment and Plan:  Pregnancy: G3P1011 at [redacted]w[redacted]d 1. Encounter for supervision of low-risk pregnancy in first trimester (Primary) FHR BP appropriate today - AFP, Serum, Open Spina Bifida  2. History of Pre-eclampsia in postpartum period Blood pressures within normal limits today  3. [redacted] weeks gestation of pregnancy   Preterm labor symptoms and general obstetric precautions including but not limited to vaginal bleeding, contractions, leaking of fluid and fetal movement were reviewed in detail with the patient. Please refer to After Visit Summary for other counseling recommendations.   No follow-ups on file.  Future Appointments  Date Time Provider Department Center  12/20/2024  9:00 AM [facility] PROVIDER 1 [facility]  12/20/2024  9:30 AM [facility] US6 [facility]  12/27/2024  3:55 PM Nicholaus Burnard HERO, MD [facility]    Norleen LULLA Rover, MD  "

## 2024-11-29 LAB — AFP, SERUM, OPEN SPINA BIFIDA
AFP MoM: 1.18
AFP Value: 79.9 ng/mL
Gest. Age on Collection Date: 20.4 wk
Maternal Age At EDD: 32.7 a
OSBR Risk 1 IN: 10000
Test Results:: NEGATIVE
Weight: 139 [lb_av]

## 2024-12-20 ENCOUNTER — Ambulatory Visit

## 2024-12-27 ENCOUNTER — Ambulatory Visit: Attending: Obstetrics and Gynecology | Admitting: Obstetrics and Gynecology

## 2024-12-27 ENCOUNTER — Encounter: Payer: Self-pay | Admitting: Obstetrics and Gynecology

## 2024-12-27 ENCOUNTER — Ambulatory Visit (HOSPITAL_BASED_OUTPATIENT_CLINIC_OR_DEPARTMENT_OTHER)

## 2024-12-27 ENCOUNTER — Other Ambulatory Visit: Payer: Self-pay

## 2024-12-27 ENCOUNTER — Ambulatory Visit: Payer: Self-pay | Admitting: Obstetrics and Gynecology

## 2024-12-27 ENCOUNTER — Encounter: Payer: Self-pay | Admitting: Family Medicine

## 2024-12-27 VITALS — BP 122/78

## 2024-12-27 VITALS — BP 122/83 | HR 84 | Wt 143.0 lb

## 2024-12-27 DIAGNOSIS — Z3A24 24 weeks gestation of pregnancy: Secondary | ICD-10-CM | POA: Diagnosis not present

## 2024-12-27 DIAGNOSIS — O1495 Unspecified pre-eclampsia, complicating the puerperium: Secondary | ICD-10-CM

## 2024-12-27 DIAGNOSIS — Z8759 Personal history of other complications of pregnancy, childbirth and the puerperium: Secondary | ICD-10-CM | POA: Insufficient documentation

## 2024-12-27 DIAGNOSIS — Z3A27 27 weeks gestation of pregnancy: Secondary | ICD-10-CM

## 2024-12-27 DIAGNOSIS — O09292 Supervision of pregnancy with other poor reproductive or obstetric history, second trimester: Secondary | ICD-10-CM | POA: Insufficient documentation

## 2024-12-27 DIAGNOSIS — Z363 Encounter for antenatal screening for malformations: Secondary | ICD-10-CM | POA: Insufficient documentation

## 2024-12-27 DIAGNOSIS — Z3492 Encounter for supervision of normal pregnancy, unspecified, second trimester: Secondary | ICD-10-CM

## 2024-12-27 DIAGNOSIS — Z3491 Encounter for supervision of normal pregnancy, unspecified, first trimester: Secondary | ICD-10-CM

## 2024-12-27 NOTE — Patient Instructions (Signed)

## 2024-12-27 NOTE — Progress Notes (Signed)
" ° °  PRENATAL VISIT NOTE  Subjective:  Katie Brown is a 34 y.o. G3P1011 at [redacted]w[redacted]d being seen today for ongoing prenatal care.  She is currently monitored for the following issues for this high-risk pregnancy and has Pre-eclampsia in postpartum period; Gastroesophageal reflux disease; Supervision of low-risk pregnancy; and History of pre-eclampsia on their problem list.  Patient reports no complaints.  Contractions: Not present. Vag. Bleeding: None.  Movement: Present. Denies leaking of fluid.   The following portions of the patient's history were reviewed and updated as appropriate: allergies, current medications, past family history, past medical history, past social history, past surgical history and problem list.   Objective:   Vitals:   12/27/24 1541  BP: 122/83  Pulse: 84  Weight: 143 lb (64.9 kg)    Fetal Status:  Fetal Heart Rate (bpm): 138   Movement: Present    General: Alert, oriented and cooperative. Patient is in no acute distress.  Skin: Skin is warm and dry. No rash noted.   Cardiovascular: Normal heart rate noted  Respiratory: Normal respiratory effort, no problems with respiration noted  Abdomen: Soft, gravid, appropriate for gestational age.  Pain/Pressure: Present (round ligament pain)     Pelvic: Cervical exam deferred        Extremities: Normal range of motion.  Edema: None  Mental Status: Normal mood and affect. Normal behavior. Normal judgment and thought content.      09/26/2024    3:58 PM 09/18/2020    2:13 PM 04/24/2016   10:50 AM  Depression screen PHQ 2/9  Decreased Interest 0 0 0  Down, Depressed, Hopeless 0 0 0  PHQ - 2 Score 0 0 0  Altered sleeping 0 0   Tired, decreased energy 0 0   Change in appetite 2 0   Feeling bad or failure about yourself  0 0   Trouble concentrating 0 0   Moving slowly or fidgety/restless 0 0   Suicidal thoughts 0 0   PHQ-9 Score 2  0    Difficult doing work/chores  Not difficult at all      Data saved with a previous  flowsheet row definition        09/26/2024    3:58 PM  GAD 7 : Generalized Anxiety Score  Nervous, Anxious, on Edge 0   Control/stop worrying 0   Worry too much - different things 0   Trouble relaxing 0   Restless 0   Easily annoyed or irritable 0   Afraid - awful might happen 0   Total GAD 7 Score 0     Data saved with a previous flowsheet row definition    Assessment and Plan:  Pregnancy: G3P1011 at [redacted]w[redacted]d  1. Encounter for supervision of low-risk pregnancy in second trimester (Primary) Anatomy today wnl, growth at 16%tile Has f/u scheduled   2. History of pre-eclampsia -postpartum Cont baby aspirin   3. [redacted] weeks gestation of pregnancy    Preterm labor symptoms and general obstetric precautions including but not limited to vaginal bleeding, contractions, leaking of fluid and fetal movement were reviewed in detail with the patient. Please refer to After Visit Summary for other counseling recommendations.   Return in about 1 month (around 01/24/2025) for high OB, 2 hr GTT, 3rd trim labs.  Future Appointments  Date Time Provider Department Center  02/21/2025  2:15 PM [facility] PROVIDER 1 [facility]  02/21/2025  2:30 PM [facility] US3 [facility]    Burnard CHRISTELLA Moats, MD  "

## 2025-01-27 ENCOUNTER — Encounter: Payer: Self-pay | Admitting: Family Medicine

## 2025-01-27 ENCOUNTER — Other Ambulatory Visit: Payer: Self-pay

## 2025-02-21 ENCOUNTER — Ambulatory Visit
